# Patient Record
Sex: Female | Born: 1955 | Race: Black or African American | Hispanic: No | Smoking: Never smoker
Health system: Southern US, Community
[De-identification: ages and names within clinical notes are randomized; demographics above are authoritative.]

## PROBLEM LIST (undated history)

## (undated) DIAGNOSIS — I10 Essential (primary) hypertension: Secondary | ICD-10-CM

## (undated) DIAGNOSIS — F209 Schizophrenia, unspecified: Secondary | ICD-10-CM

## (undated) DIAGNOSIS — D649 Anemia, unspecified: Secondary | ICD-10-CM

## (undated) DIAGNOSIS — R7303 Prediabetes: Secondary | ICD-10-CM

## (undated) DIAGNOSIS — E785 Hyperlipidemia, unspecified: Secondary | ICD-10-CM

## (undated) DIAGNOSIS — R7989 Other specified abnormal findings of blood chemistry: Secondary | ICD-10-CM

## (undated) DIAGNOSIS — F32A Depression, unspecified: Secondary | ICD-10-CM

## (undated) DIAGNOSIS — I2510 Atherosclerotic heart disease of native coronary artery without angina pectoris: Secondary | ICD-10-CM

## (undated) DIAGNOSIS — D013 Carcinoma in situ of anus and anal canal: Secondary | ICD-10-CM

## (undated) DIAGNOSIS — F329 Major depressive disorder, single episode, unspecified: Secondary | ICD-10-CM

## (undated) DIAGNOSIS — B182 Chronic viral hepatitis C: Secondary | ICD-10-CM

## (undated) DIAGNOSIS — K6282 Dysplasia of anus: Secondary | ICD-10-CM

## (undated) HISTORY — PX: POLYPECTOMY: SHX149

## (undated) HISTORY — DX: Morbid (severe) obesity due to excess calories: E66.01

## (undated) HISTORY — DX: Hyperlipidemia, unspecified: E78.5

## (undated) HISTORY — DX: Atherosclerotic heart disease of native coronary artery without angina pectoris: I25.10

## (undated) HISTORY — PX: COLONOSCOPY: SHX174

## (undated) HISTORY — DX: Anemia, unspecified: D64.9

## (undated) HISTORY — DX: Prediabetes: R73.03

## (undated) HISTORY — DX: Other specified abnormal findings of blood chemistry: R79.89

## (undated) HISTORY — DX: Dysplasia of anus: K62.82

## (undated) HISTORY — DX: Depression, unspecified: F32.A

## (undated) HISTORY — DX: Essential (primary) hypertension: I10

## (undated) HISTORY — PX: VAGINAL HYSTERECTOMY: SHX2639

## (undated) HISTORY — DX: Chronic viral hepatitis C: B18.2

## (undated) HISTORY — DX: Schizophrenia, unspecified: F20.9

---
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
## 1998-02-23 ENCOUNTER — Other Ambulatory Visit: Admission: RE | Admit: 1998-02-23 | Discharge: 1998-02-23 | Payer: Self-pay | Admitting: Family Medicine

## 1998-03-02 ENCOUNTER — Other Ambulatory Visit: Admission: RE | Admit: 1998-03-02 | Discharge: 1998-03-02 | Payer: Self-pay | Admitting: Obstetrics and Gynecology

## 1998-04-02 ENCOUNTER — Ambulatory Visit (HOSPITAL_COMMUNITY): Admission: RE | Admit: 1998-04-02 | Discharge: 1998-04-02 | Payer: Self-pay | Admitting: Obstetrics and Gynecology

## 1999-02-01 ENCOUNTER — Other Ambulatory Visit: Admission: RE | Admit: 1999-02-01 | Discharge: 1999-02-01 | Payer: Self-pay

## 1999-06-23 ENCOUNTER — Ambulatory Visit (HOSPITAL_COMMUNITY): Admission: RE | Admit: 1999-06-23 | Discharge: 1999-06-23 | Payer: Self-pay | Admitting: *Deleted

## 1999-06-29 ENCOUNTER — Ambulatory Visit (HOSPITAL_COMMUNITY): Admission: RE | Admit: 1999-06-29 | Discharge: 1999-06-29 | Payer: Self-pay | Admitting: *Deleted

## 1999-12-30 ENCOUNTER — Encounter: Admission: RE | Admit: 1999-12-30 | Discharge: 1999-12-30 | Payer: Self-pay | Admitting: *Deleted

## 2000-04-20 ENCOUNTER — Emergency Department (HOSPITAL_COMMUNITY): Admission: EM | Admit: 2000-04-20 | Discharge: 2000-04-20 | Payer: Self-pay | Admitting: Emergency Medicine

## 2000-12-18 ENCOUNTER — Other Ambulatory Visit: Admission: RE | Admit: 2000-12-18 | Discharge: 2000-12-18 | Payer: Self-pay | Admitting: Family Medicine

## 2000-12-31 ENCOUNTER — Ambulatory Visit (HOSPITAL_COMMUNITY): Admission: RE | Admit: 2000-12-31 | Discharge: 2000-12-31 | Payer: Self-pay

## 2001-11-08 ENCOUNTER — Encounter: Payer: Self-pay | Admitting: Family Medicine

## 2001-11-08 ENCOUNTER — Ambulatory Visit (HOSPITAL_COMMUNITY): Admission: RE | Admit: 2001-11-08 | Discharge: 2001-11-08 | Payer: Self-pay | Admitting: Family Medicine

## 2001-12-25 ENCOUNTER — Other Ambulatory Visit: Admission: RE | Admit: 2001-12-25 | Discharge: 2001-12-25 | Payer: Self-pay | Admitting: Family Medicine

## 2002-01-02 ENCOUNTER — Ambulatory Visit (HOSPITAL_COMMUNITY): Admission: RE | Admit: 2002-01-02 | Discharge: 2002-01-02 | Payer: Self-pay | Admitting: Family Medicine

## 2002-03-21 ENCOUNTER — Encounter (INDEPENDENT_AMBULATORY_CARE_PROVIDER_SITE_OTHER): Payer: Self-pay | Admitting: Specialist

## 2002-03-21 ENCOUNTER — Inpatient Hospital Stay (HOSPITAL_COMMUNITY): Admission: RE | Admit: 2002-03-21 | Discharge: 2002-03-23 | Payer: Self-pay | Admitting: Obstetrics and Gynecology

## 2002-10-05 ENCOUNTER — Emergency Department (HOSPITAL_COMMUNITY): Admission: EM | Admit: 2002-10-05 | Discharge: 2002-10-05 | Payer: Self-pay | Admitting: Emergency Medicine

## 2003-01-06 ENCOUNTER — Ambulatory Visit (HOSPITAL_COMMUNITY): Admission: RE | Admit: 2003-01-06 | Discharge: 2003-01-06 | Payer: Self-pay | Admitting: Family Medicine

## 2004-01-14 ENCOUNTER — Ambulatory Visit (HOSPITAL_COMMUNITY): Admission: RE | Admit: 2004-01-14 | Discharge: 2004-01-14 | Payer: Self-pay | Admitting: Obstetrics and Gynecology

## 2005-01-20 ENCOUNTER — Ambulatory Visit (HOSPITAL_COMMUNITY): Admission: RE | Admit: 2005-01-20 | Discharge: 2005-01-20 | Payer: Self-pay | Admitting: Obstetrics and Gynecology

## 2006-01-19 ENCOUNTER — Ambulatory Visit (HOSPITAL_COMMUNITY): Admission: RE | Admit: 2006-01-19 | Discharge: 2006-01-19 | Payer: Self-pay | Admitting: Obstetrics and Gynecology

## 2007-01-21 ENCOUNTER — Ambulatory Visit (HOSPITAL_COMMUNITY): Admission: RE | Admit: 2007-01-21 | Discharge: 2007-01-21 | Payer: Self-pay | Admitting: Obstetrics and Gynecology

## 2007-12-11 ENCOUNTER — Encounter (INDEPENDENT_AMBULATORY_CARE_PROVIDER_SITE_OTHER): Payer: Self-pay | Admitting: Nurse Practitioner

## 2007-12-11 ENCOUNTER — Ambulatory Visit: Payer: Self-pay | Admitting: Internal Medicine

## 2007-12-11 LAB — CONVERTED CEMR LAB
Albumin: 3.5 g/dL (ref 3.5–5.2)
Basophils Absolute: 0 10*3/uL (ref 0.0–0.1)
Basophils Relative: 0 % (ref 0–1)
Calcium: 9.5 mg/dL (ref 8.4–10.5)
Chloride: 107 meq/L (ref 96–112)
Eosinophils Absolute: 0.3 10*3/uL (ref 0.0–0.7)
Glucose, Bld: 89 mg/dL (ref 70–99)
HCT: 35.7 % — ABNORMAL LOW (ref 36.0–46.0)
Hemoglobin: 11.5 g/dL — ABNORMAL LOW (ref 12.0–15.0)
Lymphs Abs: 4.5 10*3/uL — ABNORMAL HIGH (ref 0.7–4.0)
MCV: 75.2 fL — ABNORMAL LOW (ref 78.0–100.0)
Monocytes Absolute: 0.8 10*3/uL (ref 0.1–1.0)
Monocytes Relative: 7 % (ref 3–12)
Neutrophils Relative %: 53 % (ref 43–77)
Platelets: 268 10*3/uL (ref 150–400)
Potassium: 3.9 meq/L (ref 3.5–5.3)
RBC: 4.75 M/uL (ref 3.87–5.11)
Sodium: 141 meq/L (ref 135–145)
TSH: 0.759 microintl units/mL (ref 0.350–5.50)

## 2007-12-12 ENCOUNTER — Encounter (INDEPENDENT_AMBULATORY_CARE_PROVIDER_SITE_OTHER): Payer: Self-pay | Admitting: Nurse Practitioner

## 2007-12-12 LAB — CONVERTED CEMR LAB
Hep A Total Ab: POSITIVE — AB
Hep B Core Total Ab: NEGATIVE
Hep B E Ab: NEGATIVE

## 2008-01-23 ENCOUNTER — Ambulatory Visit (HOSPITAL_COMMUNITY): Admission: RE | Admit: 2008-01-23 | Discharge: 2008-01-23 | Payer: Self-pay | Admitting: Family Medicine

## 2008-03-10 ENCOUNTER — Ambulatory Visit: Payer: Self-pay | Admitting: Internal Medicine

## 2008-03-10 LAB — CONVERTED CEMR LAB
Cholesterol: 139 mg/dL (ref 0–200)
HCV Quantitative: 45400 intl units/mL — ABNORMAL HIGH (ref <43)

## 2008-04-29 ENCOUNTER — Ambulatory Visit: Payer: Self-pay | Admitting: Internal Medicine

## 2009-01-26 ENCOUNTER — Ambulatory Visit (HOSPITAL_COMMUNITY): Admission: RE | Admit: 2009-01-26 | Discharge: 2009-01-26 | Payer: Self-pay | Admitting: Internal Medicine

## 2009-02-05 ENCOUNTER — Ambulatory Visit: Payer: Self-pay | Admitting: Family Medicine

## 2009-02-05 ENCOUNTER — Encounter (INDEPENDENT_AMBULATORY_CARE_PROVIDER_SITE_OTHER): Payer: Self-pay | Admitting: Adult Health

## 2009-02-05 LAB — CONVERTED CEMR LAB
ALT: 27 units/L (ref 0–35)
AST: 24 units/L (ref 0–37)
BUN: 7 mg/dL (ref 6–23)
CO2: 24 meq/L (ref 19–32)
HDL: 57 mg/dL (ref >39)
Potassium: 4.2 meq/L (ref 3.5–5.3)
Total CHOL/HDL Ratio: 2.4
Total Protein: 7.7 g/dL (ref 6.0–8.3)
Triglycerides: 72 mg/dL (ref <150)

## 2009-02-06 ENCOUNTER — Emergency Department (HOSPITAL_COMMUNITY): Admission: EM | Admit: 2009-02-06 | Discharge: 2009-02-07 | Payer: Self-pay | Admitting: Emergency Medicine

## 2009-03-11 ENCOUNTER — Encounter (INDEPENDENT_AMBULATORY_CARE_PROVIDER_SITE_OTHER): Payer: Self-pay | Admitting: Adult Health

## 2009-03-11 ENCOUNTER — Ambulatory Visit: Payer: Self-pay | Admitting: Internal Medicine

## 2009-03-11 LAB — CONVERTED CEMR LAB
Basophils Absolute: 0 10*3/uL (ref 0.0–0.1)
Basophils Relative: 0 % (ref 0–1)
Eosinophils Absolute: 0.1 10*3/uL (ref 0.0–0.7)
HCT: 37.8 % (ref 36.0–46.0)
Lymphocytes Relative: 36 % (ref 12–46)
Lymphs Abs: 3.4 10*3/uL (ref 0.7–4.0)
Neutro Abs: 5.5 10*3/uL (ref 1.7–7.7)
Neutrophils Relative %: 58 % (ref 43–77)
Platelets: 237 10*3/uL (ref 150–400)
RBC: 5.11 M/uL (ref 3.87–5.11)
RDW: 15 % (ref 11.5–15.5)
WBC: 9.6 10*3/uL (ref 4.0–10.5)

## 2009-05-31 ENCOUNTER — Ambulatory Visit: Payer: Self-pay | Admitting: Internal Medicine

## 2010-01-03 ENCOUNTER — Ambulatory Visit: Payer: Self-pay | Admitting: Internal Medicine

## 2010-01-03 LAB — CONVERTED CEMR LAB
Basophils Absolute: 0 10*3/uL (ref 0.0–0.1)
Basophils Relative: 0 % (ref 0–1)
Eosinophils Relative: 1 % (ref 0–5)
HCT: 38.3 % (ref 36.0–46.0)
Microalb, Ur: 0.5 mg/dL (ref 0.00–1.89)
Monocytes Absolute: 0.6 10*3/uL (ref 0.1–1.0)
Monocytes Relative: 6 % (ref 3–12)
RBC: 5.2 M/uL — ABNORMAL HIGH (ref 3.87–5.11)
RDW: 14.5 % (ref 11.5–15.5)

## 2010-01-27 ENCOUNTER — Ambulatory Visit (HOSPITAL_COMMUNITY): Admission: RE | Admit: 2010-01-27 | Discharge: 2010-01-27 | Payer: Self-pay | Admitting: Internal Medicine

## 2010-01-31 ENCOUNTER — Ambulatory Visit: Payer: Self-pay | Admitting: Family Medicine

## 2010-11-21 LAB — DIFFERENTIAL
Basophils Absolute: 0.1 10*3/uL (ref 0.0–0.1)
Eosinophils Absolute: 0.1 10*3/uL (ref 0.0–0.7)
Lymphocytes Relative: 39 % (ref 12–46)

## 2010-11-21 LAB — CBC
HCT: 42.1 % (ref 36.0–46.0)
Platelets: 266 10*3/uL (ref 150–400)
RBC: 5.62 MIL/uL — ABNORMAL HIGH (ref 3.87–5.11)

## 2010-11-21 LAB — POCT I-STAT, CHEM 8
BUN: 8 mg/dL (ref 6–23)
HCT: 43 % (ref 36.0–46.0)
Potassium: 3.5 mEq/L (ref 3.5–5.1)
TCO2: 24 mmol/L (ref 0–100)

## 2010-11-21 LAB — URINALYSIS, ROUTINE W REFLEX MICROSCOPIC
Bilirubin Urine: NEGATIVE
Hgb urine dipstick: NEGATIVE
Protein, ur: NEGATIVE mg/dL
Urobilinogen, UA: 0.2 mg/dL (ref 0.0–1.0)

## 2010-11-21 LAB — URINE CULTURE: Colony Count: 4000

## 2010-12-30 NOTE — Op Note (Signed)
Dana Ayala, Dana Ayala                     ACCOUNT NO.:  [id number]   MEDICAL RECORD NO.:  [id number]                   PATIENT TYPE:  INP   LOCATION:  0478                                 FACILITY:  [facility]   PHYSICIAN:  Katherine Roan, M.D.               DATE OF BIRTH:  [birth year]   DATE OF PROCEDURE:  03/21/2002  DATE OF DISCHARGE:  03/23/2002                                 OPERATIVE REPORT   PREOPERATIVE DIAGNOSES:  1. Large uterine fibroid.  2. Pelvic pain.  3. Menorrhagia.  4. History of anemia.  5. Exogenous obesity.  6. Mental incapacity of undetermined etiology.   POSTOPERATIVE DIAGNOSES:  1. Large uterine fibroid.  2. Pelvic pain.  3. Menorrhagia.  4. History of anemia.  5. Exogenous obesity.  6. Mental incapacity of undetermined etiology.   OPERATION:  1. Pelvic exam under anesthesia.  2. Exploratory laparotomy.  3. Total abdominal hysterectomy, right salpingo-oophorectomy.   DESCRIPTION OF PROCEDURE:  The patient was placed in lithotomy position and  prepped and draped in the usual fashion.  Prior to prep and drape, pelvic  exam under anesthesia was accomplished.  The uterus was retroverted, mobile.  No masses were noted other than this large midline and retroverted pelvic  mass.  Foley catheter was inserted, prepped and draped in the usual fashion.  It was determined that a transverse Pfannenstiel incision was probably the  best for the patient, and this was made in the abdomen right in the lower  skin crease.  Upper fascia was then dissected anteriorly and placed on  traction using the ether screen.  The inferior fascial flap was mobilized as  well.  Hemostasis with 3-0 Vicryl and the Bovie.  The peritoneum was entered  with sharp dissection.  Upper abdomen was examined.  There were some  adhesions in the midline.  Liver and gallbladder appeared to be normal. I  could not palpate stones.  I felt no upper abdominal masses.  The patient  had marked  amount of preperitoneal fat and omental fat.   Carefully, the retractor was placed into the abdomen as the Balfour  retractor was then placed with medium sides, and I carefully packed the  abdominal contents away from the pelvis using a Balfour extender.  Bladder  blade was inserted.  The uterus was then elevated, and I transected the left  uteroovarian anastomosis.  This bled quite profusely.  We arrested this  bleeding with interrupted sutures of 0 chromic.  Round ligament was  identified.  I dissected the peritoneal lining and elevated the uterus using  Kelly clamps and identified the left uteroovarian pedicle.  We were able to  transect the uteroovarian ligament and tube on the left as well as the round  ligament.  We elevated the uterus.  The uterine vessels were skeletonized  and arrested, clamped with 0 chromic suture.  Several clamps and careful  clamps down the right side  of the uterine fundus to obtain the blood supply  to the uterus was obtained, and I was able to do a fundectomy.  Following  this, we put a single-tooth tenaculum on the cervical stump and removed it  without difficulty.  The  uterosacral ligaments were plicated in the midline  for vault support.  Hemostasis was secure.  Reperitonealized the pelvis with  3-0 Vicryl.  Again, hemostasis was secure.  The right ovary appeared to be  vascularly compromised because of all the bleeding and the marked amount of  varicosities within the infundibulopelvic ligament, and I simply clamped  across the infundibulopelvic ligament and removed the right tube and ovary.  The left tube and ovary appeared normal.  Hemostasis was secure.  I  irrigated the pelvis, removed all sponges.  Sponge and needle count was  correct.  The parietal peritoneum was closed with 2-0 PDS; fascia was closed  with 2-0 PDS and interrupted sutures of 0 Vicryl.  I irrigated the  subcutaneum and closed the skin with a subcuticular suture of 4-0 PDS.  The   patient tolerated the procedure well.  Estimated blood loss was between 300-  500 cc.                                               Katherine Roan, M.D.    SDM/MEDQ  D:  03/21/2002  T:  03/24/2002  Job:  [id number]

## 2010-12-30 NOTE — Discharge Summary (Signed)
   NAMESHEELA, Dana Ayala                      ACCOUNT NO.:  [id number]   MEDICAL RECORD NO.:  [id number]                   PATIENT TYPE:  INP   LOCATION:  0478                                 FACILITY:  [facility]   PHYSICIAN:  Katherine Roan, M.D.               DATE OF BIRTH:  01/14/1956   DATE OF ADMISSION:  03/23/2002  DATE OF DISCHARGE:  03/26/2002                                 DISCHARGE SUMMARY   ADMISSION DIAGNOSES:  Large uterine fibroids, adenomyosis.   DISCHARGE DIAGNOSES:  Large uterine fibroids, adenomyosis.   OPERATION PERFORMED:  Total abdominal hysterectomy, right salpingo-  oophorectomy.   BRIEF HISTORY:  The patient is a 55 year old female admitted to the hospital  with large uterine fibroids.  She had a history of severe dysplasia of the  cervix and she desired hysterectomy.   LABORATORY STUDIES:  Admission hemoglobin was 11, hematocrit 34.  She was  O+.   HOSPITAL COURSE:  The patient was admitted to the hospital.  Underwent an  uneventful hysterectomy with removal of a right ovary.  Her postoperative  course was uncomplicated.  She remained afebrile and without complaints and  was discharged on the second postoperative day to return to the office in  two weeks.   CONDITION ON DISCHARGE:  Improved.                                               Katherine Roan, M.D.    SDM/MEDQ  D:  04/08/2002  T:  04/09/2002  Job:  [id number]

## 2010-12-30 NOTE — H&P (Signed)
NAMEJAHLEAH, Dana Ayala                      ACCOUNT NO.:  [id number]   MEDICAL RECORD NO.:  [id number]                   PATIENT TYPE:  INP   LOCATION:  0478                                 FACILITY:  [facility]   PHYSICIAN:  Katherine Roan, M.D.               DATE OF BIRTH:  11/01/1955   DATE OF ADMISSION:  03/21/2002  DATE OF DISCHARGE:  03/23/2002                                HISTORY & PHYSICAL   CHIEF COMPLAINT:  Heavy periods and pelvic pain.   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 She has a history of having a tubal ligation  and in August of 1999, had a cold knife conization for severe dysplasia. Pap  smear subsequently had been normal. Ultrasound revealed she had a large 7 cm  fibroid. Because of this, a hysterectomy was recommended.   MEDICAL DECISION MAKING:  1. Thorazine.  2. Transene.  3. Iron.  4. An arthritis pill that we are not sure of.   PAST MEDICAL HISTORY:  Other than one delivery and being mentally challenge,  is unrevealing. The patient appears to be of stated age of probably [age] to  [age]   REVIEW OF SYSTEMS:  GENERAL: Denies headaches or dizziness. HEART: No  history of hypertension or rheumatic fever. No history of heart murmur.  LUNGS: No chronic cough. GU: Denies stress incontinence. GI: No bowel habit  changes. MUSCULOSKELETAL: She complains of pain with her knees and hips.   SOCIAL HISTORY:  She does not smoke or drink.   FAMILY HISTORY:  Her mother is deceased. Her father is living and well. She  has eight siblings. She is unsure of her family history. Her mother relates  that it is unrevealing.   PHYSICAL EXAMINATION:  GENERAL: A cooperative, well developed, well  nourished, obviously immature female who is appropriate in her behavior but  who is obviously mentally challenged.  VITAL SIGNS: Weight 251  pounds. Blood pressure 140/90.  HEENT: Unremarkable. Oropharynx is not injected.  NECK: Supple. Thyroid not enlarged. Carotid pulses are equal without bruits.  There is a short neck.  BREASTS: No masses or tenderness. They are quite large.  LUNGS: Clear to auscultation and percussion.  HEART: Normal sinus rhythm. No murmur, rub, or gallop.  ABDOMEN: Markedly obese. Liver, spleen, and kidneys not palpated. Bowel  sounds are normal. No bruits are heard. No tenderness.  PELVIC: Exam reveals normal vulva and vagina. Cervix is clean. Uterus is  retroverted, about three times normal size without other adnexal masses.  EXTREMITIES: Good range of motion and equal pulses and reflexes.    IMPRESSION:  Large uterine fibroid with history of severe dysplasia.   PLAN:  Total abdominal hysterectomy and possible bilateral salpingo-  oophorectomy.  Katherine Roan, M.D.    SDM/MEDQ  D:  03/20/2002  T:  03/23/2002  Job:  [id number]

## 2011-01-12 ENCOUNTER — Other Ambulatory Visit (HOSPITAL_COMMUNITY): Payer: Self-pay | Admitting: Family Medicine

## 2011-01-12 DIAGNOSIS — Z1231 Encounter for screening mammogram for malignant neoplasm of breast: Secondary | ICD-10-CM

## 2011-01-30 ENCOUNTER — Ambulatory Visit (HOSPITAL_COMMUNITY): Payer: Self-pay

## 2011-02-08 ENCOUNTER — Ambulatory Visit (HOSPITAL_COMMUNITY)
Admission: RE | Admit: 2011-02-08 | Discharge: 2011-02-08 | Disposition: A | Discharge status: Home / Self Care | Payer: Medicaid Other | Source: Ambulatory Visit | Attending: Family Medicine | Admitting: Family Medicine

## 2011-02-08 DIAGNOSIS — Z1231 Encounter for screening mammogram for malignant neoplasm of breast: Secondary | ICD-10-CM

## 2012-01-03 ENCOUNTER — Other Ambulatory Visit (HOSPITAL_COMMUNITY): Payer: Self-pay | Admitting: Family Medicine

## 2012-01-03 DIAGNOSIS — Z1231 Encounter for screening mammogram for malignant neoplasm of breast: Secondary | ICD-10-CM

## 2012-02-09 ENCOUNTER — Ambulatory Visit (HOSPITAL_COMMUNITY)
Admission: RE | Admit: 2012-02-09 | Discharge: 2012-02-09 | Disposition: A | Discharge status: Home / Self Care | Payer: Medicaid Other | Source: Ambulatory Visit | Attending: Family Medicine | Admitting: Family Medicine

## 2012-02-09 DIAGNOSIS — Z1231 Encounter for screening mammogram for malignant neoplasm of breast: Secondary | ICD-10-CM | POA: Insufficient documentation

## 2015-11-03 ENCOUNTER — Encounter: Payer: Self-pay | Admitting: Gastroenterology

## 2015-12-27 ENCOUNTER — Ambulatory Visit (INDEPENDENT_AMBULATORY_CARE_PROVIDER_SITE_OTHER): Payer: Medicaid Other | Admitting: Gastroenterology

## 2015-12-27 ENCOUNTER — Encounter: Payer: Self-pay | Admitting: Gastroenterology

## 2015-12-27 VITALS — BP 150/100 | HR 86 | Ht 61.5 in | Wt 248.0 lb

## 2015-12-27 DIAGNOSIS — Z1211 Encounter for screening for malignant neoplasm of colon: Secondary | ICD-10-CM

## 2015-12-27 DIAGNOSIS — B182 Chronic viral hepatitis C: Secondary | ICD-10-CM | POA: Diagnosis not present

## 2015-12-27 DIAGNOSIS — B1920 Unspecified viral hepatitis C without hepatic coma: Secondary | ICD-10-CM | POA: Insufficient documentation

## 2015-12-27 MED ORDER — NA SULFATE-K SULFATE-MG SULF 17.5-3.13-1.6 GM/177ML PO SOLN: 1.0000 | Freq: Once | ORAL | Status: DC

## 2015-12-27 NOTE — Patient Instructions (Signed)
If you are age 60 or older, your body mass index should be between 23-30. Your Body mass index is 46.11 kg/(m^2). If this is out of the aforementioned range listed, please consider follow up with your Primary Care Provider.  If you are age 59 or younger, your body mass index should be between 19-25. Your Body mass index is 46.11 kg/(m^2). If this is out of the aformentioned range listed, please consider follow up with your Primary Care Provider.   You have been scheduled for a colonoscopy. Please follow written instructions given to you at your visit today.  Please pick up your prep supplies at the pharmacy within the next 1-3 days. If you use inhalers (even only as needed), please bring them with you on the day of your procedure.  Thank you for choosing [facility]  Dr Chauncey Cruel.Armbruster

## 2015-12-27 NOTE — Progress Notes (Signed)
HPI :  60 y/o female here for a new patient visit to discuss options for colon cancer screening. She is accompanied by her daughter and brother today.    No prior colon cacner screening. She has roughly one BM per day. No blood in the stools. She has occasional cramping with a bowel movements when she has a BM, and is relieved with a bowel movement reliably, but does not cause her any significant discomfort. She has occasional loose stools, but usually normal formed. No FH of colon cancer. She has been trying to lose weight, no expected weight loss.   She has been taking hepatitis C regimen, Harvoni, for the past 1.5 months or so. She has been seeing a "liver specialist" for this, although no records available, and does not remember who she sees for this. No history of known cirrhosis. Tolerating Harvoni well. She should be done within the next few weeks.   The patient was not sure which medications she was taking, was reviewed with family.   Past Medical History  Diagnosis Date  . Hypertension   . Hepatitis C, chronic ([place])   . Schizophrenia ([place])   . Morbid obesity [facility])     Past Surgical History  Procedure Laterality Date  . Vaginal hysterectomy     Family History  Problem Relation Age of Onset  . Hypertension Mother   . Hypertension Father   . Hypertension Brother    Social History  Substance Use Topics  . Smoking status: Never Smoker   . Smokeless tobacco: Never Used  . Alcohol Use: No   Current Outpatient Prescriptions  Medication Sig Dispense Refill  . buPROPion (WELLBUTRIN SR) 150 MG 12 hr tablet Take 150 mg by mouth daily.    Marland Kitchen HARVONI 90-400 MG TABS     . paliperidone (INVEGA SUSTENNA) 156 MG/ML SUSP injection Inject 156 mg into the muscle every 30 (thirty) days.    . traZODone (DESYREL) 100 MG tablet Take 200 mg by mouth at bedtime.     No current facility-administered medications for this visit.   No Known Allergies   Review of Systems: All systems  reviewed and negative except where noted in HPI.    No results found.  Physical Exam: BP 150/100 mmHg  Pulse 86  Ht 5' 1.5" (1.562 m)  Wt 248 lb (112.492 kg)  BMI 46.11 kg/m2 Constitutional: Pleasant, female in no acute distress. HEENT: Normocephalic and atraumatic. Conjunctivae are normal. No scleral icterus. Neck supple.  Cardiovascular: Normal rate, regular rhythm.  Pulmonary/chest: Effort normal and breath sounds normal. No wheezing, rales or rhonchi. Abdominal: Soft, nondistended, nontender. Protuberant / obese abdomen, Bowel sounds active throughout. There are no masses palpable. No hepatomegaly. Extremities: no edema Lymphadenopathy: No cervical adenopathy noted. Neurological: Alert and oriented to person place and time. Skin: Skin is warm and dry. No rashes noted. Psychiatric: Normal mood and affect. Behavior is normal.   ASSESSMENT AND PLAN: 60 y/o female with history of schizophrenia, obesity, and hepatitis C, here for first time colon cancer screening. I have no baseline labs for her, so will obtain these from her primary care to assess for anemia, platelet count, and INR given her history of hepatitis C. I otherwise discussed screening modalities for her to include optical colonoscopy and stool testing. Family strongly wished to have optical colonoscopy done although I think she is a good candidate for stool testing given she has no alarm symptoms. Following our discussion of this issue, she wanted to proceed with  optical colonoscopy. We will obtain prior labs in the interim to ensure okay. She agreed.   She will otherwise follow up with ordering prescriber of her Harvoni to ensure she has had an appropriate response. Family assures me she has had no evidence of cirrhosis historically, but will await her labs.   The indications, risks, and benefits of colonoscopy were explained to the patient in detail. Risks include but are not limited to bleeding, perforation, adverse  reaction to medications, and cardiopulmonary compromise. Sequelae include but are not limited to the possibility of surgery, hospitalization, and mortality. The patient verbalized understanding and wished to proceed. All questions answered, referred to the scheduler and bowel prep ordered. Further recommendations pending results of the exam.   [place] Cellar, MD [facility] Pager [phone]  CC: Illa Level, *

## 2015-12-31 ENCOUNTER — Other Ambulatory Visit: Payer: Self-pay

## 2015-12-31 DIAGNOSIS — Z1211 Encounter for screening for malignant neoplasm of colon: Secondary | ICD-10-CM

## 2016-01-03 ENCOUNTER — Telehealth: Payer: Self-pay | Admitting: Gastroenterology

## 2016-01-03 NOTE — Telephone Encounter (Signed)
Labs returned following recent visit with me in clinic as follows:  10/18/15: AP 67, AST 32, ALT 38, Alb 3.4, T prot 7.9, T bil 0,3 BUN 17, CR 0.83, Na 140, K 4.4  CBC - WBC 10.9, Hgb 12.7, HCT 39.0, platelets 251, MCV 73  Genotype Ib HCV, Fibrosis F2 on Fibrosure test HCV 11/30/2015 - undetectable RNA  We will proceed with colonoscopy as planned.

## 2016-01-17 ENCOUNTER — Ambulatory Visit (AMBULATORY_SURGERY_CENTER): Payer: Medicaid Other | Admitting: Gastroenterology

## 2016-01-17 ENCOUNTER — Encounter: Payer: Self-pay | Admitting: Gastroenterology

## 2016-01-17 VITALS — BP 124/74 | HR 86 | Temp 98.0°F | Resp 16 | Ht 61.0 in | Wt 248.0 lb

## 2016-01-17 DIAGNOSIS — D120 Benign neoplasm of cecum: Secondary | ICD-10-CM | POA: Diagnosis not present

## 2016-01-17 DIAGNOSIS — K635 Polyp of colon: Secondary | ICD-10-CM

## 2016-01-17 DIAGNOSIS — D128 Benign neoplasm of rectum: Secondary | ICD-10-CM

## 2016-01-17 DIAGNOSIS — B182 Chronic viral hepatitis C: Secondary | ICD-10-CM

## 2016-01-17 DIAGNOSIS — Z1211 Encounter for screening for malignant neoplasm of colon: Secondary | ICD-10-CM

## 2016-01-17 DIAGNOSIS — K621 Rectal polyp: Secondary | ICD-10-CM

## 2016-01-17 DIAGNOSIS — D124 Benign neoplasm of descending colon: Secondary | ICD-10-CM

## 2016-01-17 MED ORDER — SODIUM CHLORIDE 0.9 % IV SOLN: 500.0000 mL | INTRAVENOUS | Status: DC

## 2016-01-17 NOTE — Progress Notes (Signed)
Called to room to assist during endoscopic procedure.  Patient ID and intended procedure confirmed with present staff. Received instructions for my participation in the procedure from the performing physician.  

## 2016-01-17 NOTE — Progress Notes (Signed)
Report given to PACU RN, vss 

## 2016-01-17 NOTE — Patient Instructions (Signed)
YOU HAD AN ENDOSCOPIC PROCEDURE TODAY AT [facility]:   Refer to the procedure report that was given to you for any specific questions about what was found during the examination.  If the procedure report does not answer your questions, please call your gastroenterologist to clarify.  If you requested that your care partner not be given the details of your procedure findings, then the procedure report has been included in a sealed envelope for you to review at your convenience later.  YOU SHOULD EXPECT: Some feelings of bloating in the abdomen. Passage of more gas than usual.  Walking can help get rid of the air that was put into your GI tract during the procedure and reduce the bloating. If you had a lower endoscopy (such as a colonoscopy or flexible sigmoidoscopy) you may notice spotting of blood in your stool or on the toilet paper. If you underwent a bowel prep for your procedure, you may not have a normal bowel movement for a few days.  Please Note:  You might notice some irritation and congestion in your nose or some drainage.  This is from the oxygen used during your procedure.  There is no need for concern and it should clear up in a day or so.  SYMPTOMS TO REPORT IMMEDIATELY:   Following lower endoscopy (colonoscopy or flexible sigmoidoscopy):  Excessive amounts of blood in the stool  Significant tenderness or worsening of abdominal pains  Swelling of the abdomen that is new, acute  Fever of 100F or higher  For urgent or emergent issues, a gastroenterologist can be reached at any hour by calling [phone].  DIET: Your first meal following the procedure should be a small meal and then it is ok to progress to your normal diet. Heavy or fried foods are harder to digest and may make you feel nauseous or bloated.  Likewise, meals heavy in dairy and vegetables can increase bloating.  Drink plenty of fluids but you should avoid alcoholic beverages for 24 hours.  ACTIVITY:   You should plan to take it easy for the rest of today and you should NOT DRIVE or use heavy machinery until tomorrow (because of the sedation medicines used during the test).    FOLLOW UP: Our staff will call the number listed on your records the next business day following your procedure to check on you and address any questions or concerns that you may have regarding the information given to you following your procedure. If we do not reach you, we will leave a message.  However, if you are feeling well and you are not experiencing any problems, there is no need to return our call.  We will assume that you have returned to your regular daily activities without incident.  If any biopsies were taken you will be contacted by phone or by letter within the next 1-3 weeks.  Please call us at [phone] if you have not heard about the biopsies in 3 weeks.   SIGNATURES/CONFIDENTIALITY: You and/or your care partner have signed paperwork which will be entered into your electronic medical record.  These signatures attest to the fact that that the information above on your After Visit Summary has been reviewed and is understood.  Full responsibility of the confidentiality of this discharge information lies with you and/or your care-partner.  Please read over handouts about polyps, diverticulosis, hemorrhoids and high fiber diets  Await pathology  NO ASPIRIN, ASPIRIN CONTAINING PRODUCTS (BC OR GOODY POWDERS) OR NSAIDS (  IBUPROFEN, ADVIL, ALEVE, MOTRIN) FOR 2 WEEKS,  Please continue other medications

## 2016-01-17 NOTE — Op Note (Signed)
[facility] Patient Name: Dana Ayala Procedure Date: 01/17/2016 3:07 PM MRN: [id number] Endoscopist: Remo Lipps P. Havery Moros , MD Age: 60 Referring MD:  Date of Birth: 1955-08-22 Gender: Female Procedure:                Colonoscopy Indications:              Screening for malignant neoplasm in the colon, This                            is the patient's first colonoscopy Medicines:                Monitored Anesthesia Care Procedure:                Pre-Anesthesia Assessment:                           - Prior to the procedure, a History and Physical                            was performed, and patient medications and                            allergies were reviewed. The patient's tolerance of                            previous anesthesia was also reviewed. The risks                            and benefits of the procedure and the sedation                            options and risks were discussed with the patient.                            All questions were answered, and informed consent                            was obtained. Prior Anticoagulants: The patient has                            taken no previous anticoagulant or antiplatelet                            agents. ASA Grade Assessment: III - A patient with                            severe systemic disease. After reviewing the risks                            and benefits, the patient was deemed in                            satisfactory condition to undergo the procedure.  After obtaining informed consent, the colonoscope                            was passed under direct vision. Throughout the                            procedure, the patient's blood pressure, pulse, and                            oxygen saturations were monitored continuously. The                            Model CF-HQ190L [id number]) scope was introduced                            through the anus and advanced to  the the cecum,                            identified by appendiceal orifice and ileocecal                            valve. The colonoscopy was performed without                            difficulty. The patient tolerated the procedure                            well. The quality of the bowel preparation was                            adequate. Scope In: 3:19:44 PM Scope Out: 3:38:49 PM Scope Withdrawal Time: 0 hours 16 minutes 38 seconds  Total Procedure Duration: 0 hours 19 minutes 5 seconds  Findings:                 The perianal and digital rectal examinations were                            normal.                           Two sessile polyps were found in the cecum. The                            polyps were 3 mm in size. These polyps were removed                            with a cold snare. Resection and retrieval were                            complete.                           A 10 mm polyp was found in the descending colon.  The polyp was pedunculated. The polyp was removed                            with a hot snare. Resection and retrieval were                            complete.                           A 3 mm polyp was found in the rectum. The polyp was                            sessile. The polyp was removed with a cold snare.                            Resection and retrieval were complete.                           Scattered medium-mouthed diverticula were found in                            the left colon.                           Non-bleeding internal hemorrhoids were found during                            retroflexion. The hemorrhoids were small.                           The exam was otherwise without abnormality. Complications:            No immediate complications. Estimated blood loss:                            Minimal. Estimated Blood Loss:     Estimated blood loss was minimal. Impression:               - Two 3 mm polyps in  the cecum, removed with a cold                            snare. Resected and retrieved.                           - One 10 mm polyp in the descending colon, removed                            with a hot snare. Resected and retrieved.                           - One 3 mm polyp in the rectum, removed with a cold                            snare. Resected and retrieved.                           -  Diverticulosis in the left colon.                           - Non-bleeding internal hemorrhoids.                           - The examination was otherwise normal. Recommendation:           - Patient has a contact number available for                            emergencies. The signs and symptoms of potential                            delayed complications were discussed with the                            patient. Return to normal activities tomorrow.                            Written discharge instructions were provided to the                            patient.                           - Resume previous diet.                           - Continue present medications.                           - No aspirin, ibuprofen, naproxen, or other                            non-steroidal anti-inflammatory drugs for 2 weeks                            after polyp removal.                           - Await pathology results.                           - Repeat colonoscopy is recommended for                            surveillance. The colonoscopy date will be                            determined after pathology results from today's                            exam become available for review. Remo Lipps P. Armbruster, MD 01/17/2016 3:43:30 PM This report has been signed electronically.

## 2016-01-17 NOTE — Progress Notes (Signed)
No problems noted in the recovery room. maw 

## 2016-01-18 ENCOUNTER — Telehealth: Payer: Self-pay

## 2016-01-18 NOTE — Telephone Encounter (Signed)
  Follow up Call-  Call back number 01/17/2016  Post procedure Call Back phone  # [phone]  Permission to leave phone message Yes     Patient questions:  Do you have a fever, pain , or abdominal swelling? No. Pain Score  0 *  Have you tolerated food without any problems? Yes.    Have you been able to return to your normal activities? Yes.    Do you have any questions about your discharge instructions: Diet   No. Medications  No. Follow up visit  No.  Do you have questions or concerns about your Care? No.  Actions: * If pain score is 4 or above: No action needed, pain <4.

## 2016-01-20 ENCOUNTER — Encounter: Payer: Self-pay | Admitting: Gastroenterology

## 2018-06-03 ENCOUNTER — Other Ambulatory Visit: Payer: Self-pay | Admitting: Specialist

## 2018-06-03 DIAGNOSIS — Z1231 Encounter for screening mammogram for malignant neoplasm of breast: Secondary | ICD-10-CM

## 2018-07-08 ENCOUNTER — Ambulatory Visit
Admission: RE | Admit: 2018-07-08 | Discharge: 2018-07-08 | Disposition: A | Discharge status: Home / Self Care | Payer: Medicaid Other | Source: Ambulatory Visit | Attending: Specialist | Admitting: Specialist

## 2018-07-08 DIAGNOSIS — Z1231 Encounter for screening mammogram for malignant neoplasm of breast: Secondary | ICD-10-CM

## 2019-01-30 ENCOUNTER — Encounter: Payer: Self-pay | Admitting: Internal Medicine

## 2019-04-28 ENCOUNTER — Encounter: Payer: Self-pay | Admitting: Gastroenterology

## 2019-05-16 ENCOUNTER — Ambulatory Visit (AMBULATORY_SURGERY_CENTER): Payer: Self-pay | Admitting: *Deleted

## 2019-05-16 ENCOUNTER — Other Ambulatory Visit: Payer: Self-pay

## 2019-05-16 VITALS — Temp 97.6°F | Ht 61.0 in | Wt 230.0 lb

## 2019-05-16 DIAGNOSIS — Z86010 Personal history of colonic polyps: Secondary | ICD-10-CM

## 2019-05-16 MED ORDER — NA SULFATE-K SULFATE-MG SULF 17.5-3.13-1.6 GM/177ML PO SOLN: 1.0000 | Freq: Once | ORAL | 0 refills | Status: AC

## 2019-05-16 NOTE — Progress Notes (Signed)

## 2019-05-26 ENCOUNTER — Telehealth: Payer: Self-pay

## 2019-05-26 NOTE — Telephone Encounter (Signed)
Pt responded "no" to all screening questions °

## 2019-05-26 NOTE — Telephone Encounter (Signed)
Covid-19 screening questions   Do you now or have you had a fever in the last 14 days?  Do you have any respiratory symptoms of shortness of breath or cough now or in the last 14 days?  Do you have any family members or close contacts with diagnosed or suspected Covid-19 in the past 14 days?  Have you been tested for Covid-19 and found to be positive?       

## 2019-05-27 ENCOUNTER — Encounter: Payer: Self-pay | Admitting: Gastroenterology

## 2019-05-27 ENCOUNTER — Ambulatory Visit (AMBULATORY_SURGERY_CENTER): Payer: Medicaid Other | Admitting: Gastroenterology

## 2019-05-27 ENCOUNTER — Other Ambulatory Visit: Payer: Self-pay

## 2019-05-27 VITALS — BP 143/77 | HR 84 | Temp 98.2°F | Resp 13 | Ht 61.0 in | Wt 230.0 lb

## 2019-05-27 DIAGNOSIS — Z86010 Personal history of colonic polyps: Secondary | ICD-10-CM | POA: Diagnosis not present

## 2019-05-27 DIAGNOSIS — D129 Benign neoplasm of anus and anal canal: Secondary | ICD-10-CM | POA: Diagnosis not present

## 2019-05-27 DIAGNOSIS — D123 Benign neoplasm of transverse colon: Secondary | ICD-10-CM

## 2019-05-27 DIAGNOSIS — D122 Benign neoplasm of ascending colon: Secondary | ICD-10-CM

## 2019-05-27 DIAGNOSIS — D128 Benign neoplasm of rectum: Secondary | ICD-10-CM | POA: Diagnosis not present

## 2019-05-27 DIAGNOSIS — D125 Benign neoplasm of sigmoid colon: Secondary | ICD-10-CM

## 2019-05-27 MED ORDER — SODIUM CHLORIDE 0.9 % IV SOLN: 500.0000 mL | Freq: Once | INTRAVENOUS | Status: DC

## 2019-05-27 NOTE — Op Note (Addendum)
[place] Patient Name: Dana Ayala Procedure Date: 05/27/2019 11:27 AM MRN: [id number] Endoscopist: Remo Lipps P. Havery Moros , MD Age: 63 Referring MD:  Date of Birth: 1956-07-07 Gender: Female Account #: [id number] Procedure:                Colonoscopy Indications:              Surveillance: Personal history of adenomatous                            polyps (advanced adenoma) on last colonoscopy 3                            years ago Medicines:                Monitored Anesthesia Care Procedure:                Pre-Anesthesia Assessment:                           - Prior to the procedure, a History and Physical                            was performed, and patient medications and                            allergies were reviewed. The patient's tolerance of                            previous anesthesia was also reviewed. The risks                            and benefits of the procedure and the sedation                            options and risks were discussed with the patient.                            All questions were answered, and informed consent                            was obtained. Prior Anticoagulants: The patient has                            taken no previous anticoagulant or antiplatelet                            agents. ASA Grade Assessment: III - A patient with                            severe systemic disease. After reviewing the risks                            and benefits, the patient was deemed in  satisfactory condition to undergo the procedure.                           After obtaining informed consent, the colonoscope                            was passed under direct vision. Throughout the                            procedure, the patient's blood pressure, pulse, and                            oxygen saturations were monitored continuously. The                            LOANER 0255 was introduced through the  anus and                            advanced to the the cecum, identified by                            appendiceal orifice and ileocecal valve. The                            colonoscopy was performed without difficulty. The                            patient tolerated the procedure well. The quality                            of the bowel preparation was adequate. The                            ileocecal valve, appendiceal orifice, and rectum                            were photographed. Scope In: 11:31:57 AM Scope Out: 11:56:28 AM Scope Withdrawal Time: 0 hours 17 minutes 30 seconds  Total Procedure Duration: 0 hours 24 minutes 31 seconds  Findings:                 The perianal and digital rectal examinations were                            normal.                           A diminutive polyp was found in the ascending                            colon. The polyp was sessile. The polyp was removed                            with a cold snare. Resection and retrieval were  complete.                           Two sessile polyps were found in the transverse                            colon. The polyps were 3 to 4 mm in size. These                            polyps were removed with a cold snare. Resection                            and retrieval were complete.                           A diminutive polyp was found in the sigmoid colon.                            The polyp was sessile. The polyp was removed with a                            cold snare. Resection and retrieval were complete.                           A diminutive polyp was found in the rectum. The                            polyp was sessile. The polyp was removed with a                            cold snare. Resection and retrieval were complete.                           Multiple medium-mouthed diverticula were found in                            the sigmoid colon.                           A  localized area of mucosa roughly 54mm in diameter                            was found at the dentate line extending into anal                            canal. Not well seen on retroflexion, better seen                            on forward view. Unclear if benign hyperplastic                            change vs. flat adenoma / AIN. Biopsies were taken  with a cold forceps for histology.                           Internal hemorrhoids were noted. The exam was                            otherwise without abnormality. Complications:            No immediate complications. Estimated blood loss:                            Minimal. Estimated Blood Loss:     Estimated blood loss was minimal. Impression:               - One diminutive polyp in the ascending colon,                            removed with a cold snare. Resected and retrieved.                           - Two 3 to 4 mm polyps in the transverse colon,                            removed with a cold snare. Resected and retrieved.                           - One diminutive polyp in the sigmoid colon,                            removed with a cold snare. Resected and retrieved.                           - One diminutive polyp in the rectum, removed with                            a cold snare. Resected and retrieved.                           - Diverticulosis in the sigmoid colon.                           - Localized area of abnormal mucosa at the dentate                            line extending into the anal canal as outlined.                            Biopsied.                           - Internal hemorrhoids.                           - The examination was otherwise normal. Recommendation:           - Patient has a  contact number available for                            emergencies. The signs and symptoms of potential                            delayed complications were discussed with the                             patient. Return to normal activities tomorrow.                            Written discharge instructions were provided to the                            patient.                           - Resume previous diet.                           - Continue present medications.                           - Await pathology results. Remo Lipps P. Tyeisha Dinan, MD 05/27/2019 12:03:00 PM This report has been signed electronically.

## 2019-05-27 NOTE — Progress Notes (Signed)
PT taken to PACU. Monitors in place. VSS. Report given to RN. 

## 2019-05-27 NOTE — Progress Notes (Signed)
Pt's states no medical or surgical changes since previsit or office visit. 

## 2019-05-27 NOTE — Patient Instructions (Signed)
Discharge instructions given. Handouts on polyps and diverticulosis. Resume previous medications. YOU HAD AN ENDOSCOPIC PROCEDURE TODAY AT THE [facility]:   Refer to the procedure report that was given to you for any specific questions about what was found during the examination.  If the procedure report does not answer your questions, please call your gastroenterologist to clarify.  If you requested that your care partner not be given the details of your procedure findings, then the procedure report has been included in a sealed envelope for you to review at your convenience later.  YOU SHOULD EXPECT: Some feelings of bloating in the abdomen. Passage of more gas than usual.  Walking can help get rid of the air that was put into your GI tract during the procedure and reduce the bloating. If you had a lower endoscopy (such as a colonoscopy or flexible sigmoidoscopy) you may notice spotting of blood in your stool or on the toilet paper. If you underwent a bowel prep for your procedure, you may not have a normal bowel movement for a few days.  Please Note:  You might notice some irritation and congestion in your nose or some drainage.  This is from the oxygen used during your procedure.  There is no need for concern and it should clear up in a day or so.  SYMPTOMS TO REPORT IMMEDIATELY:   Following lower endoscopy (colonoscopy or flexible sigmoidoscopy):  Excessive amounts of blood in the stool  Significant tenderness or worsening of abdominal pains  Swelling of the abdomen that is new, acute  Fever of 100F or higher   For urgent or emergent issues, a gastroenterologist can be reached at any hour by calling [phone].   DIET:  We do recommend a small meal at first, but then you may proceed to your regular diet.  Drink plenty of fluids but you should avoid alcoholic beverages for 24 hours.  ACTIVITY:  You should plan to take it easy for the rest of today and you should NOT  DRIVE or use heavy machinery until tomorrow (because of the sedation medicines used during the test).    FOLLOW UP: Our staff will call the number listed on your records 48-72 hours following your procedure to check on you and address any questions or concerns that you may have regarding the information given to you following your procedure. If we do not reach you, we will leave a message.  We will attempt to reach you two times.  During this call, we will ask if you have developed any symptoms of COVID 19. If you develop any symptoms (ie: fever, flu-like symptoms, shortness of breath, cough etc.) before then, please call [phone].  If you test positive for Covid 19 in the 2 weeks post procedure, please call and report this information to us.    If any biopsies were taken you will be contacted by phone or by letter within the next 1-3 weeks.  Please call us at [phone] if you have not heard about the biopsies in 3 weeks.    SIGNATURES/CONFIDENTIALITY: You and/or your care partner have signed paperwork which will be entered into your electronic medical record.  These signatures attest to the fact that that the information above on your After Visit Summary has been reviewed and is understood.  Full responsibility of the confidentiality of this discharge information lies with you and/or your care-partner. 

## 2019-05-27 NOTE — Progress Notes (Signed)
Called to room to assist during endoscopic procedure.  Patient ID and intended procedure confirmed with present staff. Received instructions for my participation in the procedure from the performing physician.  

## 2019-05-29 ENCOUNTER — Telehealth: Payer: Self-pay | Admitting: *Deleted

## 2019-05-29 NOTE — Telephone Encounter (Signed)
  Follow up Call-  Call back number 05/27/2019  Post procedure Call Back phone  # [phone]  Permission to leave phone message No  Some recent data might be hidden     Patient questions:  Do you have a fever, pain , or abdominal swelling? No. Pain Score  0 *  Have you tolerated food without any problems? Yes.    Have you been able to return to your normal activities? Yes.    Do you have any questions about your discharge instructions: Diet   No. Medications  No. Follow up visit  No.  Do you have questions or concerns about your Care? No.  Actions: * If pain score is 4 or above: No action needed, pain <4.  1. Have you developed a fever since your procedure? no  2.   Have you had an respiratory symptoms (SOB or cough) since your procedure? no  3.   Have you tested positive for COVID 19 since your procedure no  4.   Have you had any family members/close contacts diagnosed with the COVID 19 since your procedure?  no   If yes to any of these questions please route to Joylene John, RN and Alphonsa Gin, Therapist, sports.

## 2019-06-06 ENCOUNTER — Telehealth: Payer: Self-pay | Admitting: Gastroenterology

## 2019-06-06 NOTE — Telephone Encounter (Signed)
Thanks Vaughan Basta, that is fine. She has a small area of AIN and needs to be addressed with the surgeons. Not urgent but she does need to see them, I'm happy to talk more with her about this in the office.

## 2019-06-06 NOTE — Telephone Encounter (Signed)
Sarach for [facility] called and stated we sent over a referral on the 20th .   When she reached out to patient she said that the  pt thought it was a trick and was confused she denied scheduling an appt and thought it was to soon.  She tought we should try and speak with her about this  appt

## 2019-06-06 NOTE — Telephone Encounter (Signed)
Called and spoke with pt and discussed that she had been notified of colon results earlier this week and that she was going to be referred to [facility]. [facility] called her today and she stated it was to soon. Explained we were calling to follow-up with her to see if she was ready to schedule with [facility]. Pt states it it to soon and she is depressed. Long pauses of silence on the phone. Offered to schedule pt an appt to come see Dr. Havery Moros so he can explain things more in detail for her and answer her questions. Pt scheduled to see Dr. Havery Moros 07/01/19@4pm . Pt aware of appt and Dr. Havery Moros notified.

## 2019-06-13 ENCOUNTER — Other Ambulatory Visit: Payer: Self-pay | Admitting: Internal Medicine

## 2019-06-13 DIAGNOSIS — Z1231 Encounter for screening mammogram for malignant neoplasm of breast: Secondary | ICD-10-CM

## 2019-07-01 ENCOUNTER — Telehealth: Payer: Self-pay

## 2019-07-01 ENCOUNTER — Ambulatory Visit: Payer: Medicaid Other | Admitting: Gastroenterology

## 2019-07-01 NOTE — Telephone Encounter (Signed)
Called and spoke to an individual who would not identify herself. Asked her to take a message for the pt to call back to reschedule her missed appt from today with Dr. Havery Moros and it was communicated that it is very important that she meet with Dr. Havery Moros. The individual asked what it was regarding and if it was important. She seemed very confused and was no responsive when I asked several straight forward questions.  I sense she may have been the pt but she would not identify herself.  I am mailing her a no show letter asking her to call and reschedule her appointment.

## 2019-07-01 NOTE — Progress Notes (Deleted)
HPI :   Colonoscopy 05/27/19 - The perianal and digital rectal examinations were normal. - A diminutive polyp was found in the ascending colon. The polyp was sessile. The polyp was removed with a cold snare. Resection and retrieval were complete. - Two sessile polyps were found in the transverse colon. The polyps were 3 to 4 mm in size. These polyps were removed with a cold snare. Resection and retrieval were complete. - A diminutive polyp was found in the sigmoid colon. The polyp was sessile. The polyp was removed with a cold snare. Resection and retrieval were complete. - A diminutive polyp was found in the rectum. The polyp was sessile. The polyp was removed with a cold snare. Resection and retrieval were complete. - Multiple medium-mouthed diverticula were found in the sigmoid colon. - A localized area of mucosa roughly 74mm in diameter was found at the dentate line extending into anal canal. Not well seen on retroflexion, better seen on forward view. Unclear if benign hyperplastic change vs. flat adenoma / AIN. Biopsies were taken with a cold forceps for histology.  Path shows 2 of the polyps were adenomatous, and biopsies of the dentate line c/w AIN  Colonoscopy 01/17/16 The perianal and digital rectal examinations were normal. - Two sessile polyps were found in the cecum. The polyps were 3 mm in size. These polyps were removed with a cold snare. Resection and retrieval were complete. - A 10 mm polyp was found in the descending colon. The polyp was pedunculated. The polyp was removed with a hot snare. Resection and retrieval were complete. - A 3 mm polyp was found in the rectum. The polyp was sessile. The polyp was removed with a cold snare. Resection and retrieval were complete. - Scattered medium-mouthed diverticula were found in the left colon. - Non-bleeding internal hemorrhoids were found during retroflexion. The hemorrhoids were small. - The exam was otherwise without  abnormality.   Past Medical History:  Diagnosis Date  . Anemia   . Depression   . Hepatitis C, chronic ([place])   . Hypertension   . Morbid obesity ([place])   . Schizophrenia [facility])      Past Surgical History:  Procedure Laterality Date  . COLONOSCOPY    . POLYPECTOMY    . VAGINAL HYSTERECTOMY     Family History  Problem Relation Age of Onset  . Hypertension Mother   . Hypertension Father   . Hypertension Brother   . Colon polyps Neg Hx   . Colon cancer Neg Hx   . Esophageal cancer Neg Hx   . Rectal cancer Neg Hx   . Stomach cancer Neg Hx    Social History   Tobacco Use  . Smoking status: Never Smoker  . Smokeless tobacco: Never Used  Substance Use Topics  . Alcohol use: No    Alcohol/week: 0.0 standard drinks  . Drug use: No   Current Outpatient Medications  Medication Sig Dispense Refill  . buPROPion (WELLBUTRIN SR) 150 MG 12 hr tablet Take 150 mg by mouth daily.    . paliperidone (INVEGA SUSTENNA) 156 MG/ML SUSP injection Inject 156 mg into the muscle every 30 (thirty) days.    . traZODone (DESYREL) 100 MG tablet Take 200 mg by mouth at bedtime.     No current facility-administered medications for this visit.    No Known Allergies   Review of Systems: All systems reviewed and negative except where noted in HPI.    No results found.  Physical Exam: There  were no vitals taken for this visit. Constitutional: Pleasant,well-developed, ***female in no acute distress. HEENT: Normocephalic and atraumatic. Conjunctivae are normal. No scleral icterus. Neck supple.  Cardiovascular: Normal rate, regular rhythm.  Pulmonary/chest: Effort normal and breath sounds normal. No wheezing, rales or rhonchi. Abdominal: Soft, nondistended, nontender. Bowel sounds active throughout. There are no masses palpable. No hepatomegaly. Extremities: no edema Lymphadenopathy: No cervical adenopathy noted. Neurological: Alert and oriented to person place and time. Skin: Skin is warm  and dry. No rashes noted. Psychiatric: Normal mood and affect. Behavior is normal.   ASSESSMENT AND PLAN:  Illa Level, *

## 2019-08-04 ENCOUNTER — Other Ambulatory Visit: Payer: Self-pay

## 2019-08-04 ENCOUNTER — Ambulatory Visit
Admission: RE | Admit: 2019-08-04 | Discharge: 2019-08-04 | Disposition: A | Discharge status: Home / Self Care | Payer: Medicaid Other | Source: Ambulatory Visit | Attending: Internal Medicine | Admitting: Internal Medicine

## 2019-08-04 DIAGNOSIS — Z1231 Encounter for screening mammogram for malignant neoplasm of breast: Secondary | ICD-10-CM

## 2019-08-12 ENCOUNTER — Encounter: Payer: Self-pay | Admitting: Gastroenterology

## 2019-08-12 ENCOUNTER — Ambulatory Visit (INDEPENDENT_AMBULATORY_CARE_PROVIDER_SITE_OTHER): Payer: Medicaid Other | Admitting: Gastroenterology

## 2019-08-12 VITALS — BP 160/80 | HR 96 | Temp 98.2°F | Ht 60.25 in | Wt 237.0 lb

## 2019-08-12 DIAGNOSIS — D013 Carcinoma in situ of anus and anal canal: Secondary | ICD-10-CM | POA: Diagnosis not present

## 2019-08-12 DIAGNOSIS — Z860100 Personal history of colon polyps, unspecified: Secondary | ICD-10-CM | POA: Insufficient documentation

## 2019-08-12 DIAGNOSIS — Z8619 Personal history of other infectious and parasitic diseases: Secondary | ICD-10-CM

## 2019-08-12 DIAGNOSIS — K6282 Dysplasia of anus: Secondary | ICD-10-CM | POA: Insufficient documentation

## 2019-08-12 NOTE — Patient Instructions (Signed)
If you are age 63 or older, your body mass index should be between 23-30. Your Body mass index is 45.9 kg/m. If this is out of the aforementioned range listed, please consider follow up with your Primary Care Provider.  If you are age 51 or younger, your body mass index should be between 19-25. Your Body mass index is 45.9 kg/m. If this is out of the aformentioned range listed, please consider follow up with your Primary Care Provider.   Referral to [facility].

## 2019-08-12 NOTE — Progress Notes (Signed)
HPI :  63 year old female here for a follow-up visit.  I know her from prior screening colonoscopies, she also has a history of hepatitis C with eradication.  I performed her colonoscopy in 2017 at which time she had an advanced adenoma, 10 mm descending colon polyp.  She had a follow-up colonoscopy with me in October 2020.  She had 5 diminutive polyps removed, 2 of which were adenomatous, told to have a 5-year follow-up for surveillance of polyps.  Incidentally she also had changes in the rectum that returned consistent with AIN, this was new compared to her last colonoscopy.  We had recommended a referral to [facility] colorectal surgery for the AIN.  They tried contacting her but she did not make a follow-up appointment.  Our office tried to contact her but could not get through.  She is accompanied by her brother today, named Eligah East, he coordinates most of her health care and oversees her healthcare decisions.  He confirms that she has followed up with hepatology in recent years and was told she has eradicated hepatitis C and there is no evidence of liver disease  She otherwise feels well today without complaints.  She denies any abdominal pains.  No perianal symptoms such as pain, bleeding etc.     Past Medical History:  Diagnosis Date  . AIN (anal intraepithelial neoplasia) anal canal   . Anemia   . Depression   . Hepatitis C, chronic ([place])   . Hypertension   . Morbid obesity ([place])   . Schizophrenia [facility])      Past Surgical History:  Procedure Laterality Date  . COLONOSCOPY    . POLYPECTOMY    . VAGINAL HYSTERECTOMY     Family History  Problem Relation Age of Onset  . Hypertension Mother   . Hypertension Father   . Hypertension Brother   . Colon polyps Neg Hx   . Colon cancer Neg Hx   . Esophageal cancer Neg Hx   . Rectal cancer Neg Hx   . Stomach cancer Neg Hx    Social History   Tobacco Use  . Smoking status: Never Smoker  . Smokeless tobacco: Never Used    Substance Use Topics  . Alcohol use: No    Alcohol/week: 0.0 standard drinks  . Drug use: No   Current Outpatient Medications  Medication Sig Dispense Refill  . buPROPion (WELLBUTRIN SR) 150 MG 12 hr tablet Take 150 mg by mouth daily.    . paliperidone (INVEGA SUSTENNA) 156 MG/ML SUSP injection Inject 156 mg into the muscle every 30 (thirty) days.    . traZODone (DESYREL) 100 MG tablet Take 200 mg by mouth at bedtime.     No current facility-administered medications for this visit.   No Known Allergies   Review of Systems: All systems reviewed and negative except where noted in HPI.    Physical Exam: BP (!) 160/80 (BP Location: Left Arm, Patient Position: Sitting, Cuff Size: Large)   Pulse 96   Temp 98.2 F (36.8 C)   Ht 5' 0.25" (1.53 m) Comment: height measured without shoes  Wt 237 lb (107.5 kg)   BMI 45.90 kg/m  Constitutional: Pleasant,well-developed, female in no acute distress. HEENT: Normocephalic and atraumatic. Conjunctivae are normal. No scleral icterus. Neck supple.  Cardiovascular: Normal rate, regular rhythm.  Pulmonary/chest: Effort normal and breath sounds normal. No wheezing, rales or rhonchi. Abdominal: Soft, protuberant, nontender. There are no masses palpable. Extremities: no edema Lymphadenopathy: No cervical adenopathy noted. Neurological: Alert  and oriented to person place and time. Skin: Skin is warm and dry. No rashes noted. Psychiatric: Normal mood and affect. Behavior is normal.   ASSESSMENT AND PLAN: 63 year old female here for reassessment of the following:  AIN - this is a new development on her most recent colonoscopy.  I discussed what this is with her and risks for anal cancer if this is not treated.  She has not followed up with colorectal surgery as previously discussed.  I outlined the importance of this and discussed with her brother who states he will help coordinate this follow-up with the surgeons and she is agreeable to proceed.   Eligah East is a point of contact her brother, his phone number is [phone].  Referral placed to colorectal surgery  History of colon polyps - repeat colonoscopy in 5 years  History of hep C - eradicated  Peoria Cellar, MD [facility]

## 2019-12-29 ENCOUNTER — Telehealth: Payer: Self-pay

## 2019-12-29 NOTE — Telephone Encounter (Signed)
NOTES ON FILE FROM [facility] [phone], SENT REFERRAL TO SCHEDULING 

## 2020-01-05 ENCOUNTER — Encounter: Payer: Self-pay | Admitting: General Practice

## 2020-01-28 ENCOUNTER — Telehealth: Payer: Self-pay | Admitting: Cardiology

## 2020-01-28 NOTE — Telephone Encounter (Signed)
Spoke with frank, aware okay for 1 person to attend appointment.

## 2020-01-28 NOTE — Telephone Encounter (Signed)
° ° °  Pilar Plate calling, he said he or pt's daughter Barbaraann Share needs to come in with pt to help her and be there to hear pt's plan of care

## 2020-01-30 ENCOUNTER — Ambulatory Visit (INDEPENDENT_AMBULATORY_CARE_PROVIDER_SITE_OTHER): Payer: Medicaid Other | Admitting: Cardiology

## 2020-01-30 ENCOUNTER — Other Ambulatory Visit: Payer: Self-pay

## 2020-01-30 ENCOUNTER — Encounter: Payer: Self-pay | Admitting: Cardiology

## 2020-01-30 ENCOUNTER — Telehealth: Payer: Self-pay | Admitting: *Deleted

## 2020-01-30 VITALS — BP 140/78 | HR 92 | Temp 97.0°F | Ht 62.0 in | Wt 241.0 lb

## 2020-01-30 DIAGNOSIS — I10 Essential (primary) hypertension: Secondary | ICD-10-CM | POA: Diagnosis not present

## 2020-01-30 DIAGNOSIS — R0683 Snoring: Secondary | ICD-10-CM

## 2020-01-30 DIAGNOSIS — R011 Cardiac murmur, unspecified: Secondary | ICD-10-CM | POA: Diagnosis not present

## 2020-01-30 NOTE — Progress Notes (Signed)
Cardiology Office Note:    Date:  01/30/2020   ID:  Anjoli, Diemer [birth year], MRN [id number]  PCP:  Illa Level, NP  Cardiologist:  No primary care provider on file.  Electrophysiologist:  None   Referring MD: Boyce Medici, FNP   Chief Complaint  Patient presents with  . Heart Murmur    History of Present Illness:    Dana Ayala is a 64 y.o. female with a hx of hypertension, morbid obesity, hyperlipidemia, schizophrenia who is referred by Volney Presser, FNP for evaluation of heart murmur.  She reports that she is not very active, as the most exertion she does is walking around her home.  She denies any chest pain, dyspnea, lightheadedness, syncope, or palpitations.  Does report intermittent lower extremity edema.  States that she has been told she snores.  No smoking history.  Family history includes father had CABG in his 51s.  Recent labs on 11/27/2019 showed LDL 123, creatinine 1.05, A1c 6%, TSH 1.2, hemoglobin 11.5  Past Medical History:  Diagnosis Date  . AIN (anal intraepithelial neoplasia) anal canal   . Anemia   . Depression   . Hepatitis C, chronic ([place])   . Hypertension   . Morbid obesity ([place])   . Schizophrenia [facility])     Past Surgical History:  Procedure Laterality Date  . COLONOSCOPY    . POLYPECTOMY    . VAGINAL HYSTERECTOMY      Current Medications: Current Meds  Medication Sig  . buPROPion (WELLBUTRIN SR) 150 MG 12 hr tablet Take 150 mg by mouth daily.  . paliperidone (INVEGA SUSTENNA) 156 MG/ML SUSP injection Inject 156 mg into the muscle every 30 (thirty) days.  . traZODone (DESYREL) 100 MG tablet Take 200 mg by mouth at bedtime.     Allergies:   Patient has no known allergies.   Social History   Socioeconomic History  . Marital status: Widowed    Spouse name: Not on file  . Number of children: 1  . Years of education: Not on file  . Highest education level: Not on file  Occupational History  . Not  on file  Tobacco Use  . Smoking status: Never Smoker  . Smokeless tobacco: Never Used  Substance and Sexual Activity  . Alcohol use: No    Alcohol/week: 0.0 standard drinks  . Drug use: No  . Sexual activity: Not on file  Other Topics Concern  . Not on file  Social History Narrative  . Not on file   Social Determinants of Health   Financial Resource Strain:   . Difficulty of Paying Living Expenses:   Food Insecurity:   . Worried About Charity fundraiser in the Last Year:   . Arboriculturist in the Last Year:   Transportation Needs:   . Film/video editor (Medical):   Marland Kitchen Lack of Transportation (Non-Medical):   Physical Activity:   . Days of Exercise per Week:   . Minutes of Exercise per Session:   Stress:   . Feeling of Stress :   Social Connections:   . Frequency of Communication with Friends and Family:   . Frequency of Social Gatherings with Friends and Family:   . Attends Religious Services:   . Active Member of Clubs or Organizations:   . Attends Archivist Meetings:   Marland Kitchen Marital Status:      Family History: The patient's family history includes Hypertension in her brother, father, and mother.  There is no history of Colon polyps, Colon cancer, Esophageal cancer, Rectal cancer, or Stomach cancer.  ROS:   Please see the history of present illness.    All other systems reviewed and are negative.  EKGs/Labs/Other Studies Reviewed:    The following studies were reviewed today:   EKG:  EKG is  ordered today.  The ekg ordered today demonstrates normal sinus rhythm, rate 92, nonspecific T wave flattening   Recent Labs: No results found for requested labs within last 8760 hours.  Recent Lipid Panel    Component Value Date/Time   CHOL 139 02/05/2009 0000   TRIG 72 02/05/2009 0000   HDL 57 02/05/2009 0000   CHOLHDL 2.4 Ratio 02/05/2009 0000   VLDL 14 02/05/2009 0000   LDLCALC 68 02/05/2009 0000    Physical Exam:    VS:  BP 140/78   Pulse 92    Temp (!) 97 F (36.1 C)   Ht 5\' 2"  (1.575 m)   Wt 241 lb (109.3 kg)   SpO2 98%   BMI 44.08 kg/m     Wt Readings from Last 3 Encounters:  01/30/20 241 lb (109.3 kg)  08/12/19 237 lb (107.5 kg)  05/27/19 230 lb (104.3 kg)     GEN:  in no acute distress HEENT: Normal NECK: No JVD; No carotid bruits LYMPHATICS: No lymphadenopathy CARDIAC: RRR, 2 out of 6 systolic murmur loudest at left upper sternal border RESPIRATORY:  Clear to auscultation without rales, wheezing or rhonchi  ABDOMEN: Soft, non-tender, non-distended MUSCULOSKELETAL:  No edema; No deformity  SKIN: Warm and dry NEUROLOGIC:  Alert and oriented x 3 PSYCHIATRIC:  Normal affect   ASSESSMENT:    1. Heart murmur   2. Hypertension, unspecified type   3. Morbid obesity ([place])   4. Snoring    PLAN:    Heart murmur: 2/6 systolic murmur on exam, will evaluate further with echocardiogram  Hypertension: On lisinopril 30 mg daily, chlorthalidone 25 mg daily, and amlodipine 10 mg daily  Hyperlipidemia: On rosuvastatin 10 mg daily.  LDL 123 on 11/27/2019  Snoring: Will order sleep study  RTC in 1 to 2 months   Medication Adjustments/Labs and Tests Ordered: Current medicines are reviewed at length with the patient today.  Concerns regarding medicines are outlined above.  Orders Placed This Encounter  Procedures  . EKG 12-Lead  . ECHOCARDIOGRAM COMPLETE  . Split night study   No orders of the defined types were placed in this encounter.   Patient Instructions  Medication Instructions:  Your physician recommends that you continue on your current medications as directed. Please refer to the Current Medication list given to you today.  Testing/Procedures: Your physician has requested that you have an echocardiogram. Echocardiography is a painless test that uses sound waves to create images of your heart. It provides your doctor with information about the size and shape of your heart and how well your heart's  chambers and valves are working. This procedure takes approximately one hour. There are no restrictions for this procedure. This will be done at our [facility] location:  [place] has recommended that you have a sleep study. This test records several body functions during sleep, including: brain activity, eye movement, oxygen and carbon dioxide blood levels, heart rate and rhythm, breathing rate and rhythm, the flow of air through your mouth and nose, snoring, body muscle movements, and chest and belly movement. --this must be approved by insurance prior to  scheduling   Follow-Up: At [facility], you and your health needs are our priority.  As part of our continuing mission to provide you with exceptional heart care, we have created designated Provider Care Teams.  These Care Teams include your primary Cardiologist (physician) and Advanced Practice Providers (APPs -  Physician Assistants and Nurse Practitioners) who all work together to provide you with the care you need, when you need it.  We recommend signing up for the patient portal called "MyChart".  Sign up information is provided on this After Visit Summary.  MyChart is used to connect with patients for Virtual Visits (Telemedicine).  Patients are able to view lab/test results, encounter notes, upcoming appointments, etc.  Non-urgent messages can be sent to your provider as well.   To learn more about what you can do with MyChart, go to [email or web address].    Your next appointment:   1-2 month(s)  The format for your next appointment:   In Person  Provider:   Oswaldo Milian, MD      Signed, Donato Heinz, MD  01/30/2020 4:53 PM    [place]

## 2020-01-30 NOTE — Patient Instructions (Signed)
Medication Instructions:  Your physician recommends that you continue on your current medications as directed. Please refer to the Current Medication list given to you today.  Testing/Procedures: Your physician has requested that you have an echocardiogram. Echocardiography is a painless test that uses sound waves to create images of your heart. It provides your doctor with information about the size and shape of your heart and how well your heart's chambers and valves are working. This procedure takes approximately one hour. There are no restrictions for this procedure. This will be done at our [facility] location:  [place] has recommended that you have a sleep study. This test records several body functions during sleep, including: brain activity, eye movement, oxygen and carbon dioxide blood levels, heart rate and rhythm, breathing rate and rhythm, the flow of air through your mouth and nose, snoring, body muscle movements, and chest and belly movement. --this must be approved by insurance prior to scheduling   Follow-Up: At [facility], you and your health needs are our priority.  As part of our continuing mission to provide you with exceptional heart care, we have created designated Provider Care Teams.  These Care Teams include your primary Cardiologist (physician) and Advanced Practice Providers (APPs -  Physician Assistants and Nurse Practitioners) who all work together to provide you with the care you need, when you need it.  We recommend signing up for the patient portal called "MyChart".  Sign up information is provided on this After Visit Summary.  MyChart is used to connect with patients for Virtual Visits (Telemedicine).  Patients are able to view lab/test results, encounter notes, upcoming appointments, etc.  Non-urgent messages can be sent to your provider as well.   To learn more about what you can do with MyChart, go to  [email or web address].    Your next appointment:   1-2 month(s)  The format for your next appointment:   In Person  Provider:   Oswaldo Milian, MD

## 2020-01-30 NOTE — Telephone Encounter (Signed)
Patient's brother notified of sleep study and Covid appointment details.

## 2020-02-02 ENCOUNTER — Ambulatory Visit (HOSPITAL_COMMUNITY): Payer: Medicaid Other | Attending: Cardiology

## 2020-02-02 ENCOUNTER — Other Ambulatory Visit: Payer: Self-pay

## 2020-02-02 DIAGNOSIS — R011 Cardiac murmur, unspecified: Secondary | ICD-10-CM | POA: Insufficient documentation

## 2020-02-20 ENCOUNTER — Other Ambulatory Visit (HOSPITAL_COMMUNITY): Payer: Medicaid Other

## 2020-02-22 ENCOUNTER — Encounter (HOSPITAL_BASED_OUTPATIENT_CLINIC_OR_DEPARTMENT_OTHER): Payer: Medicaid Other | Admitting: Cardiovascular Disease

## 2020-03-02 ENCOUNTER — Encounter: Payer: Self-pay | Admitting: Cardiology

## 2020-03-02 ENCOUNTER — Telehealth: Payer: Self-pay | Admitting: *Deleted

## 2020-03-02 ENCOUNTER — Other Ambulatory Visit: Payer: Self-pay

## 2020-03-02 ENCOUNTER — Ambulatory Visit (INDEPENDENT_AMBULATORY_CARE_PROVIDER_SITE_OTHER): Payer: Medicaid Other | Admitting: Cardiology

## 2020-03-02 VITALS — BP 138/62 | HR 115 | Ht 62.0 in | Wt 240.6 lb

## 2020-03-02 DIAGNOSIS — I10 Essential (primary) hypertension: Secondary | ICD-10-CM | POA: Diagnosis not present

## 2020-03-02 DIAGNOSIS — R0683 Snoring: Secondary | ICD-10-CM | POA: Diagnosis not present

## 2020-03-02 DIAGNOSIS — R011 Cardiac murmur, unspecified: Secondary | ICD-10-CM

## 2020-03-02 DIAGNOSIS — E785 Hyperlipidemia, unspecified: Secondary | ICD-10-CM

## 2020-03-02 NOTE — Telephone Encounter (Signed)
Patient notified of new sleep study appointment.

## 2020-03-02 NOTE — Patient Instructions (Signed)
Medication Instructions:  Your physician recommends that you continue on your current medications as directed. Please refer to the Current Medication list given to you today.  *If you need a refill on your cardiac medications before your next appointment, please call your pharmacy*  Testing/Procedures: CT coronary calcium score. This test is done at [address]. This is $150 out of pocket.   Coronary CalciumScan A coronary calcium scan is an imaging test used to look for deposits of calcium and other fatty materials (plaques) in the inner lining of the blood vessels of the heart (coronary arteries). These deposits of calcium and plaques can partly clog and narrow the coronary arteries without producing any symptoms or warning signs. This puts a person at risk for a heart attack. This test can detect these deposits before symptoms develop. Tell a health care provider about:  Any allergies you have.  All medicines you are taking, including vitamins, herbs, eye drops, creams, and over-the-counter medicines.  Any problems you or family members have had with anesthetic medicines.  Any blood disorders you have.  Any surgeries you have had.  Any medical conditions you have.  Whether you are pregnant or may be pregnant. What are the risks? Generally, this is a safe procedure. However, problems may occur, including:  Harm to a pregnant woman and her unborn baby. This test involves the use of radiation. Radiation exposure can be dangerous to a pregnant woman and her unborn baby. If you are pregnant, you generally should not have this procedure done.  Slight increase in the risk of cancer. This is because of the radiation involved in the test. What happens before the procedure? No preparation is needed for this procedure. What happens during the procedure?  You will undress and remove any jewelry around your neck or chest.  You will put on a hospital gown.  Sticky  electrodes will be placed on your chest. The electrodes will be connected to an electrocardiogram (ECG) machine to record a tracing of the electrical activity of your heart.  A CT scanner will take pictures of your heart. During this time, you will be asked to lie still and hold your breath for 2-3 seconds while a picture of your heart is being taken. The procedure may vary among health care providers and hospitals. What happens after the procedure?  You can get dressed.  You can return to your normal activities.  It is up to you to get the results of your test. Ask your health care provider, or the department that is doing the test, when your results will be ready. Summary  A coronary calcium scan is an imaging test used to look for deposits of calcium and other fatty materials (plaques) in the inner lining of the blood vessels of the heart (coronary arteries).  Generally, this is a safe procedure. Tell your health care provider if you are pregnant or may be pregnant.  No preparation is needed for this procedure.  A CT scanner will take pictures of your heart.  You can return to your normal activities after the scan is done. This information is not intended to replace advice given to you by your health care provider. Make sure you discuss any questions you have with your health care provider. Document Released: 01/27/2008 Document Revised: 06/19/2016 Document Reviewed: 06/19/2016 Elsevier Interactive Patient Education  2017 [employer].   ---reschedule sleep study  Follow-Up: At [facility], you and your health needs are our priority.  As part of our continuing mission to provide you with exceptional heart care, we have created designated Provider Care Teams.  These Care Teams include your primary Cardiologist (physician) and Advanced Practice Providers (APPs -  Physician Assistants and Nurse Practitioners) who all work together to provide you with the care you need, when you need  it.  We recommend signing up for the patient portal called "MyChart".  Sign up information is provided on this After Visit Summary.  MyChart is used to connect with patients for Virtual Visits (Telemedicine).  Patients are able to view lab/test results, encounter notes, upcoming appointments, etc.  Non-urgent messages can be sent to your provider as well.   To learn more about what you can do with MyChart, go to [email or web address].    Your next appointment:   12 month(s)  The format for your next appointment:   In Person  Provider:   Oswaldo Milian, MD

## 2020-03-02 NOTE — Progress Notes (Signed)
Cardiology Office Note:    Date:  03/02/2020   ID:  Dana, Ayala [birth year], MRN [id number]  PCP:  Illa Level, NP  Cardiologist:  No primary care provider on file.  Electrophysiologist:  None   Referring MD: Illa Level, *   Chief Complaint  Patient presents with  . Follow-up    17months  . Heart Murmur    History of Present Illness:    Dana Ayala is a 64 y.o. female with a hx of hypertension, morbid obesity, hyperlipidemia, schizophrenia who presents for follow-up.  She was referred by Volney Presser, FNP for evaluation of heart murmur, initially seen on 01/30/2020.  She reports that she is not very active, as the most exertion she does is walking around her home.  She denies any chest pain, dyspnea, lightheadedness, syncope, or palpitations.  Does report intermittent lower extremity edema.  States that she has been told she snores.  No smoking history.  Family history includes father had CABG in his 72s.  Recent labs on 11/27/2019 showed LDL 123, creatinine 1.05, A1c 6%, TSH 1.2, hemoglobin 11.5.  Echocardiogram on 02/02/2020 showed normal biventricular function, grade 1 diastolic dysfunction, no significant valvular disease.  Since last clinic visit, she reports that she has been doing well.  She denies any chest pain, dyspnea, lightheadedness, syncope, palpitations, or lower extremity edema.  Does not check her blood pressure at home.  Past Medical History:  Diagnosis Date  . AIN (anal intraepithelial neoplasia) anal canal   . Anemia   . Depression   . Hepatitis C, chronic ([place])   . Hypertension   . Morbid obesity ([place])   . Schizophrenia [facility])     Past Surgical History:  Procedure Laterality Date  . COLONOSCOPY    . POLYPECTOMY    . VAGINAL HYSTERECTOMY      Current Medications: Current Meds  Medication Sig  . buPROPion (WELLBUTRIN SR) 150 MG 12 hr tablet Take 150 mg by mouth daily.  . paliperidone (INVEGA  SUSTENNA) 156 MG/ML SUSP injection Inject 156 mg into the muscle every 30 (thirty) days.  . traZODone (DESYREL) 100 MG tablet Take 200 mg by mouth at bedtime.     Allergies:   Patient has no known allergies.   Social History   Socioeconomic History  . Marital status: Widowed    Spouse name: Not on file  . Number of children: 1  . Years of education: Not on file  . Highest education level: Not on file  Occupational History  . Not on file  Tobacco Use  . Smoking status: Never Smoker  . Smokeless tobacco: Never Used  Substance and Sexual Activity  . Alcohol use: No    Alcohol/week: 0.0 standard drinks  . Drug use: No  . Sexual activity: Not on file  Other Topics Concern  . Not on file  Social History Narrative  . Not on file   Social Determinants of Health   Financial Resource Strain:   . Difficulty of Paying Living Expenses:   Food Insecurity:   . Worried About Charity fundraiser in the Last Year:   . Arboriculturist in the Last Year:   Transportation Needs:   . Film/video editor (Medical):   Marland Kitchen Lack of Transportation (Non-Medical):   Physical Activity:   . Days of Exercise per Week:   . Minutes of Exercise per Session:   Stress:   . Feeling of Stress :   Social Connections:   .  Frequency of Communication with Friends and Family:   . Frequency of Social Gatherings with Friends and Family:   . Attends Religious Services:   . Active Member of Clubs or Organizations:   . Attends Archivist Meetings:   Marland Kitchen Marital Status:      Family History: The patient's family history includes Hypertension in her brother, father, and mother. There is no history of Colon polyps, Colon cancer, Esophageal cancer, Rectal cancer, or Stomach cancer.  ROS:   Please see the history of present illness.    All other systems reviewed and are negative.  EKGs/Labs/Other Studies Reviewed:    The following studies were reviewed today:   EKG:  EKG is  Not ordered today.  The  ekg ordered most recently demonstrates normal sinus rhythm, rate 92, nonspecific T wave flattening   Recent Labs: No results found for requested labs within last 8760 hours.  Recent Lipid Panel    Component Value Date/Time   CHOL 139 02/05/2009 0000   TRIG 72 02/05/2009 0000   HDL 57 02/05/2009 0000   CHOLHDL 2.4 Ratio 02/05/2009 0000   VLDL 14 02/05/2009 0000   LDLCALC 68 02/05/2009 0000    Physical Exam:    VS:  BP 138/62 (BP Location: Right Arm, Patient Position: Sitting, Cuff Size: Large)   Pulse (!) 115   Ht 5\' 2"  (1.575 m)   Wt 240 lb 9.6 oz (109.1 kg)   SpO2 97%   BMI 44.01 kg/m     Wt Readings from Last 3 Encounters:  03/02/20 240 lb 9.6 oz (109.1 kg)  01/30/20 241 lb (109.3 kg)  08/12/19 237 lb (107.5 kg)     GEN:  in no acute distress HEENT: Normal NECK: No JVD CARDIAC: RRR, 2 out of 6 systolic murmur loudest at left upper sternal border RESPIRATORY:  Clear to auscultation without rales, wheezing or rhonchi  ABDOMEN: Soft, non-tender, non-distended MUSCULOSKELETAL:  No edema; No deformity  SKIN: Warm and dry NEUROLOGIC:  Alert and oriented x 3 PSYCHIATRIC:  Normal affect   ASSESSMENT:    1. Hyperlipidemia, unspecified hyperlipidemia type   2. Heart murmur   3. Hypertension, unspecified type   4. Snoring    PLAN:    Heart murmur: 2/6 systolic murmur on exam.  Echocardiogram on 02/02/2020 showed normal biventricular function, grade 1 diastolic dysfunction, no significant valvular disease.  Hypertension: On lisinopril 30 mg daily, chlorthalidone 25 mg daily, and amlodipine 10 mg daily.  Appears controlled  Hyperlipidemia: On rosuvastatin 10 mg daily.  LDL 123 on 11/27/2019, will check calcium score for further risk stratification  Snoring: Sleep study pending  RTC in 1 year   Medication Adjustments/Labs and Tests Ordered: Current medicines are reviewed at length with the patient today.  Concerns regarding medicines are outlined above.  Orders  Placed This Encounter  Procedures  . CT CARDIAC SCORING   No orders of the defined types were placed in this encounter.   Patient Instructions  Medication Instructions:  Your physician recommends that you continue on your current medications as directed. Please refer to the Current Medication list given to you today.  *If you need a refill on your cardiac medications before your next appointment, please call your pharmacy*  Testing/Procedures: CT coronary calcium score. This test is done at [address]. This is $150 out of pocket.   Coronary CalciumScan A coronary calcium scan is an imaging test used to look for deposits of calcium and other fatty  materials (plaques) in the inner lining of the blood vessels of the heart (coronary arteries). These deposits of calcium and plaques can partly clog and narrow the coronary arteries without producing any symptoms or warning signs. This puts a person at risk for a heart attack. This test can detect these deposits before symptoms develop. Tell a health care provider about:  Any allergies you have.  All medicines you are taking, including vitamins, herbs, eye drops, creams, and over-the-counter medicines.  Any problems you or family members have had with anesthetic medicines.  Any blood disorders you have.  Any surgeries you have had.  Any medical conditions you have.  Whether you are pregnant or may be pregnant. What are the risks? Generally, this is a safe procedure. However, problems may occur, including:  Harm to a pregnant woman and her unborn baby. This test involves the use of radiation. Radiation exposure can be dangerous to a pregnant woman and her unborn baby. If you are pregnant, you generally should not have this procedure done.  Slight increase in the risk of cancer. This is because of the radiation involved in the test. What happens before the procedure? No preparation is needed for this  procedure. What happens during the procedure?  You will undress and remove any jewelry around your neck or chest.  You will put on a hospital gown.  Sticky electrodes will be placed on your chest. The electrodes will be connected to an electrocardiogram (ECG) machine to record a tracing of the electrical activity of your heart.  A CT scanner will take pictures of your heart. During this time, you will be asked to lie still and hold your breath for 2-3 seconds while a picture of your heart is being taken. The procedure may vary among health care providers and hospitals. What happens after the procedure?  You can get dressed.  You can return to your normal activities.  It is up to you to get the results of your test. Ask your health care provider, or the department that is doing the test, when your results will be ready. Summary  A coronary calcium scan is an imaging test used to look for deposits of calcium and other fatty materials (plaques) in the inner lining of the blood vessels of the heart (coronary arteries).  Generally, this is a safe procedure. Tell your health care provider if you are pregnant or may be pregnant.  No preparation is needed for this procedure.  A CT scanner will take pictures of your heart.  You can return to your normal activities after the scan is done. This information is not intended to replace advice given to you by your health care provider. Make sure you discuss any questions you have with your health care provider. Document Released: 01/27/2008 Document Revised: 06/19/2016 Document Reviewed: 06/19/2016 Elsevier Interactive Patient Education  2017 [employer].   ---reschedule sleep study  Follow-Up: At [facility], you and your health needs are our priority.  As part of our continuing mission to provide you with exceptional heart care, we have created designated Provider Care Teams.  These Care Teams include your primary Cardiologist  (physician) and Advanced Practice Providers (APPs -  Physician Assistants and Nurse Practitioners) who all work together to provide you with the care you need, when you need it.  We recommend signing up for the patient portal called "MyChart".  Sign up information is provided on this After Visit Summary.  MyChart is used to connect with patients for  Virtual Visits (Telemedicine).  Patients are able to view lab/test results, encounter notes, upcoming appointments, etc.  Non-urgent messages can be sent to your provider as well.   To learn more about what you can do with MyChart, go to [email or web address].    Your next appointment:   12 month(s)  The format for your next appointment:   In Person  Provider:   Oswaldo Milian, MD      Signed, Donato Heinz, MD  03/02/2020 8:25 AM    [place]

## 2020-03-11 ENCOUNTER — Ambulatory Visit (INDEPENDENT_AMBULATORY_CARE_PROVIDER_SITE_OTHER)
Admission: RE | Admit: 2020-03-11 | Discharge: 2020-03-11 | Disposition: A | Discharge status: Home / Self Care | Payer: Self-pay | Source: Ambulatory Visit | Attending: Cardiology | Admitting: Cardiology

## 2020-03-11 ENCOUNTER — Other Ambulatory Visit: Payer: Self-pay

## 2020-03-11 DIAGNOSIS — E785 Hyperlipidemia, unspecified: Secondary | ICD-10-CM

## 2020-03-19 ENCOUNTER — Other Ambulatory Visit: Payer: Self-pay

## 2020-03-19 MED ORDER — ROSUVASTATIN CALCIUM 10 MG PO TABS: 10.0000 mg | ORAL_TABLET | Freq: Every day | ORAL | 3 refills | Status: DC

## 2020-03-24 ENCOUNTER — Encounter (HOSPITAL_BASED_OUTPATIENT_CLINIC_OR_DEPARTMENT_OTHER): Payer: Medicaid Other | Admitting: Cardiovascular Disease

## 2020-09-08 ENCOUNTER — Other Ambulatory Visit: Payer: Self-pay | Admitting: Internal Medicine

## 2020-09-08 DIAGNOSIS — Z1231 Encounter for screening mammogram for malignant neoplasm of breast: Secondary | ICD-10-CM

## 2020-10-21 ENCOUNTER — Other Ambulatory Visit: Payer: Self-pay

## 2020-10-21 ENCOUNTER — Ambulatory Visit
Admission: RE | Admit: 2020-10-21 | Discharge: 2020-10-21 | Disposition: A | Discharge status: Home / Self Care | Payer: Medicaid Other | Source: Ambulatory Visit | Attending: Internal Medicine | Admitting: Internal Medicine

## 2020-10-21 DIAGNOSIS — Z1231 Encounter for screening mammogram for malignant neoplasm of breast: Secondary | ICD-10-CM

## 2021-04-06 ENCOUNTER — Ambulatory Visit: Payer: Medicaid Other | Admitting: Cardiology

## 2021-04-07 NOTE — Progress Notes (Signed)
Cardiology Office Note:    Date:  04/08/2021   ID:  Dana Ayala, DOB 05-Feb-1956, MRN [id number]  PCP:  Illa Level, NP  Cardiologist:  None  Electrophysiologist:  None   Referring MD: Illa Level, *   Chief Complaint  Patient presents with   Hypertension     History of Present Illness:    Dana Ayala is a 65 y.o. female with a hx of hypertension, morbid obesity, hyperlipidemia, schizophrenia who presents for follow-up.  She was referred by Volney Presser, FNP for evaluation of heart murmur, initially seen on 01/30/2020.  She reports that she is not very active, as the most exertion she does is walking around her home.  She denies any chest pain, dyspnea, lightheadedness, syncope, or palpitations.  Does report intermittent lower extremity edema.  States that she has been told she snores.  No smoking history.  Family history includes father had CABG in his 24s.  Echocardiogram on 02/02/2020 showed normal biventricular function, grade 1 diastolic dysfunction, no significant valvular disease.  Calcium score 6 on 03/11/2020 (70th percentile)  Since last clinic visit, denies any chest pain, dyspnea, syncope, lower extremity edema, or palpitations.  Reports some lightheadedness after taking her medication.  She walks twice per week for 3 to 4 hours.   Past Medical History:  Diagnosis Date   AIN (anal intraepithelial neoplasia) anal canal    Anemia    Depression    Hepatitis C, chronic ([facility])    Hypertension    Morbid obesity ([place])    Schizophrenia ([place])     Past Surgical History:  Procedure Laterality Date   COLONOSCOPY     POLYPECTOMY     VAGINAL HYSTERECTOMY      Current Medications: Current Meds  Medication Sig   amLODipine (NORVASC) 10 MG tablet Take 10 mg by mouth daily.   buPROPion (WELLBUTRIN SR) 150 MG 12 hr tablet Take 150 mg by mouth daily.   paliperidone (INVEGA SUSTENNA) 156 MG/ML SUSP injection Inject 156 mg into the muscle every 30  (thirty) days.   traZODone (DESYREL) 100 MG tablet Take 200 mg by mouth at bedtime.     Allergies:   Patient has no known allergies.   Social History   Socioeconomic History   Marital status: Widowed    Spouse name: Not on file   Number of children: 1   Years of education: Not on file   Highest education level: Not on file  Occupational History   Not on file  Tobacco Use   Smoking status: Never   Smokeless tobacco: Never  Substance and Sexual Activity   Alcohol use: No    Alcohol/week: 0.0 standard drinks   Drug use: No   Sexual activity: Not on file  Other Topics Concern   Not on file  Social History Narrative   Not on file   Social Determinants of Health   Financial Resource Strain: Not on file  Food Insecurity: Not on file  Transportation Needs: Not on file  Physical Activity: Not on file  Stress: Not on file  Social Connections: Not on file     Family History: The patient's family history includes Hypertension in her brother, father, and mother. There is no history of Colon polyps, Colon cancer, Esophageal cancer, Rectal cancer, or Stomach cancer.  ROS:   Please see the history of present illness.    All other systems reviewed and are negative.  EKGs/Labs/Other Studies Reviewed:    The following studies were reviewed  today:   EKG:  EKG is  ordered today.  The ekg ordered demonstrates normal sinus rhythm, rate 73, nonspecific T wave flattening   Recent Labs: No results found for requested labs within last 8760 hours.  Recent Lipid Panel    Component Value Date/Time   CHOL 139 02/05/2009 0000   TRIG 72 02/05/2009 0000   HDL 57 02/05/2009 0000   CHOLHDL 2.4 Ratio 02/05/2009 0000   VLDL 14 02/05/2009 0000   LDLCALC 68 02/05/2009 0000    Physical Exam:    VS:  BP (!) 146/91   Pulse 73   Ht '5\' 2"'$  (1.575 m)   Wt 215 lb 12.8 oz (97.9 kg)   SpO2 96%   BMI 39.47 kg/m     Wt Readings from Last 3 Encounters:  04/08/21 215 lb 12.8 oz (97.9 kg)   03/02/20 240 lb 9.6 oz (109.1 kg)  01/30/20 241 lb (109.3 kg)     GEN:  in no acute distress HEENT: Normal NECK: No JVD CARDIAC: RRR, no murmur heard RESPIRATORY:  Clear to auscultation without rales, wheezing or rhonchi  ABDOMEN: Soft, non-tender, non-distended MUSCULOSKELETAL:  No edema; No deformity  SKIN: Warm and dry NEUROLOGIC:  Alert and oriented x 3 PSYCHIATRIC:  Normal affect   ASSESSMENT:    1. Heart murmur   2. Hypertension, unspecified type   3. Hyperlipidemia, unspecified hyperlipidemia type   4. Snoring     PLAN:    Heart murmur: Previously with 2/6 systolic murmur on exam, there are no murmur on exam today.  Echocardiogram on 02/02/2020 showed normal biventricular function, grade 1 diastolic dysfunction, no significant valvular disease.  Hypertension: Previously was on lisinopril 30 mg daily, chlorthalidone 25 mg daily, and amlodipine 10 mg daily.  She is not sure what she is taking now.  BP elevated in clinic, asked to call and let us know what her BP meds are.  Check CMP  Hyperlipidemia: LDL 123 on 11/27/2019, Calcium score 6 on 03/11/2020 (70th percentile).  Started rosuvastatin 10 mg daily.  We will recheck lipid panel.  Snoring: Sleep study ordered last year but was not done.  Agreeable to having done now  RTC in 1 year   Medication Adjustments/Labs and Tests Ordered: Current medicines are reviewed at length with the patient today.  Concerns regarding medicines are outlined above.  No orders of the defined types were placed in this encounter.  No orders of the defined types were placed in this encounter.   Patient Instructions  Medication Instructions:  Please call when you get home to let us know what blood pressure medication you are taking  *If you need a refill on your cardiac medications before your next appointment, please call your pharmacy*   Lab Work: CMET, CBC, Lipid, TSH  If you have labs (blood work) drawn today and your tests are  completely normal, you will receive your results only by: [place] (if you have MyChart) OR A paper copy in the mail If you have any lab test that is abnormal or we need to change your treatment, we will call you to review the results.   Testing/Procedures: Your physician has recommended that you have a sleep study. This test records several body functions during sleep, including: brain activity, eye movement, oxygen and carbon dioxide blood levels, heart rate and rhythm, breathing rate and rhythm, the flow of air through your mouth and nose, snoring, body muscle movements, and chest and belly movement.  Follow-Up: At [facility],  you and your health needs are our priority.  As part of our continuing mission to provide you with exceptional heart care, we have created designated Provider Care Teams.  These Care Teams include your primary Cardiologist (physician) and Advanced Practice Providers (APPs -  Physician Assistants and Nurse Practitioners) who all work together to provide you with the care you need, when you need it.  We recommend signing up for the patient portal called "MyChart".  Sign up information is provided on this After Visit Summary.  MyChart is used to connect with patients for Virtual Visits (Telemedicine).  Patients are able to view lab/test results, encounter notes, upcoming appointments, etc.  Non-urgent messages can be sent to your provider as well.   To learn more about what you can do with MyChart, go to [email or web address].    Your next appointment:   12 month(s)  The format for your next appointment:   In Person  Provider:   Oswaldo Milian, MD      Signed, Donato Heinz, MD  04/08/2021 8:54 AM    [place]

## 2021-04-08 ENCOUNTER — Ambulatory Visit (INDEPENDENT_AMBULATORY_CARE_PROVIDER_SITE_OTHER): Payer: Medicaid Other | Admitting: Cardiology

## 2021-04-08 ENCOUNTER — Other Ambulatory Visit: Payer: Self-pay

## 2021-04-08 ENCOUNTER — Encounter: Payer: Self-pay | Admitting: Cardiology

## 2021-04-08 VITALS — BP 146/91 | HR 73 | Ht 62.0 in | Wt 215.8 lb

## 2021-04-08 DIAGNOSIS — E785 Hyperlipidemia, unspecified: Secondary | ICD-10-CM | POA: Diagnosis not present

## 2021-04-08 DIAGNOSIS — R011 Cardiac murmur, unspecified: Secondary | ICD-10-CM | POA: Diagnosis not present

## 2021-04-08 DIAGNOSIS — R0683 Snoring: Secondary | ICD-10-CM | POA: Diagnosis not present

## 2021-04-08 DIAGNOSIS — I10 Essential (primary) hypertension: Secondary | ICD-10-CM | POA: Diagnosis not present

## 2021-04-08 NOTE — Patient Instructions (Signed)
Medication Instructions:  Please call when you get home to let us know what blood pressure medication you are taking  *If you need a refill on your cardiac medications before your next appointment, please call your pharmacy*   Lab Work: CMET, CBC, Lipid, TSH  If you have labs (blood work) drawn today and your tests are completely normal, you will receive your results only by: [place] (if you have MyChart) OR A paper copy in the mail If you have any lab test that is abnormal or we need to change your treatment, we will call you to review the results.   Testing/Procedures: Your physician has recommended that you have a sleep study. This test records several body functions during sleep, including: brain activity, eye movement, oxygen and carbon dioxide blood levels, heart rate and rhythm, breathing rate and rhythm, the flow of air through your mouth and nose, snoring, body muscle movements, and chest and belly movement.  Follow-Up: At [facility], you and your health needs are our priority.  As part of our continuing mission to provide you with exceptional heart care, we have created designated Provider Care Teams.  These Care Teams include your primary Cardiologist (physician) and Advanced Practice Providers (APPs -  Physician Assistants and Nurse Practitioners) who all work together to provide you with the care you need, when you need it.  We recommend signing up for the patient portal called "MyChart".  Sign up information is provided on this After Visit Summary.  MyChart is used to connect with patients for Virtual Visits (Telemedicine).  Patients are able to view lab/test results, encounter notes, upcoming appointments, etc.  Non-urgent messages can be sent to your provider as well.   To learn more about what you can do with MyChart, go to [email or web address].    Your next appointment:   12 month(s)  The format for your next appointment:   In Person  Provider:    Oswaldo Milian, MD

## 2021-04-08 NOTE — Addendum Note (Signed)
Addended by: Patria Mane A on: 04/08/2021 08:58 AM   Modules accepted: Orders

## 2021-04-09 LAB — TSH: TSH: 1.13 u[IU]/mL (ref 0.450–4.500)

## 2021-04-09 LAB — COMPREHENSIVE METABOLIC PANEL
ALT: 21 IU/L (ref 0–32)
AST: 30 IU/L (ref 0–40)
Albumin/Globulin Ratio: 1.3 (ref 1.2–2.2)
Albumin: 4.7 g/dL (ref 3.8–4.8)
Alkaline Phosphatase: 76 IU/L (ref 44–121)
BUN/Creatinine Ratio: 13 (ref 12–28)
BUN: 15 mg/dL (ref 8–27)
Bilirubin Total: 0.2 mg/dL (ref 0.0–1.2)
CO2: 25 mmol/L (ref 20–29)
Calcium: 9.9 mg/dL (ref 8.7–10.3)
Chloride: 100 mmol/L (ref 96–106)
Creatinine, Ser: 1.17 mg/dL — ABNORMAL HIGH (ref 0.57–1.00)
Globulin, Total: 3.6 g/dL (ref 1.5–4.5)
Glucose: 98 mg/dL (ref 65–99)
Potassium: 3.6 mmol/L (ref 3.5–5.2)
Sodium: 141 mmol/L (ref 134–144)
Total Protein: 8.3 g/dL (ref 6.0–8.5)
eGFR: 52 mL/min/{1.73_m2} — ABNORMAL LOW (ref >59)

## 2021-04-09 LAB — LIPID PANEL
Chol/HDL Ratio: 2.4 ratio (ref 0.0–4.4)
Cholesterol, Total: 106 mg/dL (ref 100–199)
HDL: 45 mg/dL (ref >39)
LDL Chol Calc (NIH): 47 mg/dL (ref 0–99)
Triglycerides: 66 mg/dL (ref 0–149)
VLDL Cholesterol Cal: 14 mg/dL (ref 5–40)

## 2021-04-09 LAB — CBC
Hematocrit: 35.1 % (ref 34.0–46.6)
Hemoglobin: 11.2 g/dL (ref 11.1–15.9)
MCH: 23.3 pg — ABNORMAL LOW (ref 26.6–33.0)
MCHC: 31.9 g/dL (ref 31.5–35.7)
MCV: 73 fL — ABNORMAL LOW (ref 79–97)
Platelets: 223 10*3/uL (ref 150–450)
RBC: 4.8 x10E6/uL (ref 3.77–5.28)
RDW: 15.3 % (ref 11.7–15.4)
WBC: 8.7 10*3/uL (ref 3.4–10.8)

## 2021-04-11 ENCOUNTER — Telehealth (HOSPITAL_BASED_OUTPATIENT_CLINIC_OR_DEPARTMENT_OTHER): Payer: Self-pay

## 2021-04-11 NOTE — Telephone Encounter (Signed)
Pt's brother called to updated Dr. Gardiner Rhyme regarding pt's medication list and noted she is taking the following listed below.   Chlorthalidone 25 mg  Amlodipine 10 mg Lisinopril 30 mg     Medication list updated and message sent to MD.

## 2021-04-15 NOTE — Addendum Note (Signed)
Addended by: Jacqulynn Cadet on: 04/15/2021 04:58 PM   Modules accepted: Orders

## 2021-12-28 ENCOUNTER — Other Ambulatory Visit: Payer: Self-pay | Admitting: Nurse Practitioner

## 2021-12-28 ENCOUNTER — Other Ambulatory Visit: Payer: Self-pay | Admitting: Internal Medicine

## 2021-12-28 DIAGNOSIS — Z1231 Encounter for screening mammogram for malignant neoplasm of breast: Secondary | ICD-10-CM

## 2022-01-11 ENCOUNTER — Ambulatory Visit
Admission: RE | Admit: 2022-01-11 | Discharge: 2022-01-11 | Disposition: A | Discharge status: Home / Self Care | Payer: Medicaid Other | Source: Ambulatory Visit | Attending: Nurse Practitioner | Admitting: Nurse Practitioner

## 2022-01-11 DIAGNOSIS — Z1231 Encounter for screening mammogram for malignant neoplasm of breast: Secondary | ICD-10-CM

## 2022-06-29 NOTE — Progress Notes (Signed)
Cardiology Office Note:    Date:  06/30/2022   ID:  Dana Ayala, DOB [birth year], MRN [id number]  PCP:  Illa Level, [facility] Cardiologist:  Donato Heinz, MD     Referring MD: Illa Level, *   CC: Here for 1 year follow-up  History of Present Illness:    Dana Ayala is a 66 y.o. female with a hx of the following:   Coronary artery calcification Hep C HLD HTN Obesity Schizophrenia Prediabetes   Patient was referred by Volney Presser, FNP for evaluation of heart murmur and was initially seen in 2021.  Patient previously reported she was not very active, stated most activity she performed was walking around her home.  Her father had a CABG in the 80s had previously been told that she snores.  Denied any smoking history.  Last seen by Dr. Gardiner Rhyme on April 08, 2021 and was overall doing well from a cardiac perspective.  Echocardiogram in 2021 revealed normal biventricular function, no significant valvular abnormalities, grade 1 DD.  In July 2021 a coronary calcium score was revealed to be 6, 70th percentile for her demographic.  At last OV with Dr. Gardiner Rhyme, she did note some lightheadedness after taking her medication.  She noted that she walked twice a week for 3 to 4 hours, overall was doing well from a cardiac perspective.  Sleep study was ordered for a year before but was not done and patient was agreeable to repeat sleep study last year.  Was told to follow-up in 1 year.  Today she presents for 1 year follow-up.  She states she is doing very well since last year.  She tells me she did not have sleep study done as she did not want to have this performed.  Continues to decline this today.  Denies any chest pain, shortness of breath, palpitations, orthopnea, PND, syncope, presyncope, dizziness, increased swelling, bleeding, or claudication.  She continues to walk around 2 times per week. BP stable at home.  Family member  in the room states she just had blood work done last month with primary care provider.  Denies any other questions or concerns.   Past Medical History:  Diagnosis Date   AIN (anal intraepithelial neoplasia) anal canal    Anemia    Coronary artery calcification    Depression    Elevated serum creatinine    Hepatitis C, chronic (HCC)    Hyperlipidemia    Hypertension    Morbid obesity ([place])    Prediabetes    Schizophrenia ([place])     Past Surgical History:  Procedure Laterality Date   COLONOSCOPY     POLYPECTOMY     VAGINAL HYSTERECTOMY      Current Medications: Current Meds  Medication Sig   amLODipine (NORVASC) 10 MG tablet Take 10 mg by mouth daily.   buPROPion (WELLBUTRIN SR) 150 MG 12 hr tablet Take 150 mg by mouth daily.   chlorthalidone (HYGROTON) 25 MG tablet Take 25 mg by mouth daily.   lisinopril (ZESTRIL) 30 MG tablet Take 30 mg by mouth daily.   paliperidone (INVEGA SUSTENNA) 156 MG/ML SUSP injection Inject 156 mg into the muscle every 30 (thirty) days.   traZODone (DESYREL) 100 MG tablet Take 200 mg by mouth at bedtime.     Allergies:   Patient has no known allergies.   Social History   Socioeconomic History   Marital status: Widowed    Spouse name: Not on file  Number of children: 1   Years of education: Not on file   Highest education level: Not on file  Occupational History   Not on file  Tobacco Use   Smoking status: Never   Smokeless tobacco: Never  Substance and Sexual Activity   Alcohol use: No    Alcohol/week: 0.0 standard drinks of alcohol   Drug use: No   Sexual activity: Not on file  Other Topics Concern   Not on file  Social History Narrative   Not on file   Social Determinants of Health   Financial Resource Strain: Not on file  Food Insecurity: Not on file  Transportation Needs: Not on file  Physical Activity: Not on file  Stress: Not on file  Social Connections: Not on file     Family History: The patient's family history  includes Hypertension in her brother, father, and mother. There is no history of Colon polyps, Colon cancer, Esophageal cancer, Rectal cancer, or Stomach cancer.  ROS:   Review of Systems  Constitutional: Negative.   HENT: Negative.    Eyes: Negative.   Respiratory: Negative.    Cardiovascular: Negative.   Gastrointestinal: Negative.   Genitourinary: Negative.   Musculoskeletal: Negative.   Skin: Negative.   Neurological: Negative.   Endo/Heme/Allergies: Negative.   Psychiatric/Behavioral: Negative.       Please see the history of present illness.    All other systems reviewed and are negative.  EKGs/Labs/Other Studies Reviewed:    The following studies were reviewed today:   EKG:  EKG is ordered today.  The ekg ordered today demonstrates NSR, 83 bpm, no acute ischemic changes.   CT cardiac scoring on March 11, 2020: 1.  Coronary calcium score 6.  This was 70th percentile for her demographic. 2.  No acute or significant extracardiac abnormality.  2D complete echo on February 02, 2020:  1. Left ventricular ejection fraction, by estimation, is 65 to 70%. The  left ventricle has hyperdynamic function. The left ventricle has no  regional wall motion abnormalities. There is mild left ventricular  hypertrophy. Left ventricular diastolic  parameters are consistent with Grade I diastolic dysfunction (impaired  relaxation).   2. Right ventricular systolic function is normal. The right ventricular  size is normal. Tricuspid regurgitation signal is inadequate for assessing  PA pressure.   3. The mitral valve is normal in structure. No evidence of mitral valve  regurgitation. No evidence of mitral stenosis.   4. The aortic valve is tricuspid. Aortic valve regurgitation is not  visualized. No aortic stenosis is present.   5. The inferior vena cava is normal in size with greater than 50%  respiratory variability, suggesting right atrial pressure of 3 mmHg.  Recent Labs: No results  found for requested labs within last 365 days.   Recent Lipid Panel    Component Value Date/Time   CHOL 106 04/08/2021 0916   TRIG 66 04/08/2021 0916   HDL 45 04/08/2021 0916   CHOLHDL 2.4 04/08/2021 0916   CHOLHDL 2.4 Ratio 02/05/2009 0000   VLDL 14 02/05/2009 0000   LDLCALC 47 04/08/2021 0916   Physical Exam:    VS:  BP 124/72 (BP Location: Left Arm, Patient Position: Sitting, Cuff Size: Large)   Pulse 83   Ht _0  (1.575 m)   Wt 224 lb 6.4 oz (101.8 kg)   SpO2 99%   BMI 41.04 kg/m     Wt Readings from Last 3 Encounters:  06/30/22 224 lb 6.4 oz (101.8 kg)  04/08/21 215 lb 12.8 oz (97.9 kg)  03/02/20 240 lb 9.6 oz (109.1 kg)     GEN: Obese, 66 y.o. African American female in NAD  HEENT: Left eye exotropia, EOMI, otherwise normal NECK: No JVD; No carotid bruits CARDIAC: S1/S2, RRR, no murmurs, rubs, gallops; 2+ peripheral pulses throughout, strong and equal bilaterally RESPIRATORY:  Clear and diminished to auscultation without rales, wheezing or rhonchi  MUSCULOSKELETAL:  generalized Nonpitting edema, otherwise nothing acute ; No deformity  SKIN: Warm and dry NEUROLOGIC:  Alert and oriented x 3 PSYCHIATRIC:  Normal affect   ASSESSMENT:    1. Coronary artery calcification   2. Hyperlipidemia, unspecified hyperlipidemia type   3. Hypertension, unspecified type   4. Prediabetes   5. Elevated serum creatinine   6. Morbid obesity ([place])    PLAN:    In order of problems listed above:  Coronary artery calcification CT cardiac scoring 2021 revealed coronary calcium score 6, therefore she was in 70th percentile for her demographic. Stable with no anginal symptoms. No indication for ischemic evaluation.  Will increase Crestor to 20 mg daily for recent LDL of 136, will obtain FLP and LFT in 2 months.  Continue lisinopril and Crestor. Heart healthy diet and regular cardiovascular exercise encouraged.   Hyperlipidemia Labs obtained 1 month ago revealed total cholesterol  198, triglycerides 75, HDL 48, and LDL 136.  I discussed with her I want her LDL to be less than 100.  We will increase Crestor to 20 mg daily.  We will obtain FLP and LFT in 2 months. Heart healthy diet and regular cardiovascular exercise encouraged.   Hypertension Blood pressure today is stable at 124/72.  BP is well controlled at home.  Continue amlodipine, chlorthalidone, and lisinopril. Discussed to monitor BP at home at least 2 hours after medications and sitting for 5-10 minutes. Heart healthy diet and regular cardiovascular exercise encouraged.  We will obtain BMET today.  Prediabetes Recent A1c last month 6.2%.  PCP to manage. Heart healthy diet and regular cardiovascular exercise encouraged.   Elevated serum creatinine Labs obtained last month revealed serum creatinine 3, EGFR 44.  This is increased from 1 year ago.  She does have risk factors for developing chronic kidney disease including hypertension and she has prediabetes.  She is also on several medications that could be contributing to this.  We will obtain BMET today.  If kidney function remains the same or continues to decline, plan to adjust medications and/or refer to nephrology.  Morbid Obesity BMI today is 41.04 weight is 224 pounds.  She is up 9 pounds from 1 year ago. Weight loss via diet and exercise encouraged. Discussed the impact being overweight would have on cardiovascular risk. I made a recommendation for prep program referral, she currently defers this at this time.  I recommended she increase her physical activity and gave American Heart Association's recommendation of at least 150 minutes of moderate activity per week.  Heart healthy, low-salt diet recommended.   7.  Disposition: Follow-up in 1 year or sooner if anything changes.   Medication Adjustments/Labs and Tests Ordered: Current medicines are reviewed at length with the patient today.  Concerns regarding medicines are outlined above.  Orders Placed This  Encounter  Procedures   Lipid panel   Hepatic function panel   Basic metabolic panel   EKG 18-HUDJ   Meds ordered this encounter  Medications   rosuvastatin (CRESTOR) 20 MG tablet    Sig: Take 1 tablet (20 mg total) by  mouth daily.    Dispense:  90 tablet    Refill:  3    Patient Instructions  Medication Instructions:  INCREASE CRESTOR 20MG DAILY-YOU MAY TAKE 2 OF THE DOSE YOU HAVE./ NEW RX WAS SENT TO PHARMACY  *If you need a refill on your cardiac medications before your next appointment, please call your pharmacy*  Lab Work: FASTING LIPID AND LFT IN 2 MONTHS AND BMET TODAY If you have labs (blood work) drawn today and your tests are completely normal, you will receive your results only by:  [facility] (if you have MyChart) OR  A paper copy in the mail  If you have any lab test that is abnormal or we need to change your treatment, we will call you to review the results.  Testing/Procedures: NONE  Other Instructions PLEASE READ AND FOLLOW ATTACHED MEDITERRANEAN DIET  PLEASE READ AND FOLLOW ATTACHED SALTY 6  Follow-Up: At [facility], you and your health needs are our priority.  As part of our continuing mission to provide you with exceptional heart care, we have created designated Provider Care Teams.  These Care Teams include your primary Cardiologist (physician) and Advanced Practice Providers (APPs -  Physician Assistants and Nurse Practitioners) who all work together to provide you with the care you need, when you need it.  We recommend signing up for the patient portal called "MyChart".  Sign up information is provided on this After Visit Summary.  MyChart is used to connect with patients for Virtual Visits (Telemedicine).  Patients are able to view lab/test results, encounter notes, upcoming appointments, etc.  Non-urgent messages can be sent to your provider as well.   To learn more about what you can do with MyChart, go to [email or web address].     Your next appointment:   12 month(s)  The format for your next appointment:   In Person  Provider:   Donato Heinz, MD     Important Information About [place] Diet A Mediterranean diet refers to food and lifestyle choices that are based on the traditions of countries located on the [place]. It focuses on eating more fruits, vegetables, whole grains, beans, nuts, seeds, and heart-healthy fats, and eating less dairy, meat, eggs, and processed foods with added sugar, salt, and fat. This way of eating has been shown to help prevent certain conditions and improve outcomes for people who have chronic diseases, like kidney disease and heart disease. What are tips for following this plan? Reading food labels Check the serving size of packaged foods. For foods such as rice and pasta, the serving size refers to the amount of cooked product, not dry. Check the total fat in packaged foods. Avoid foods that have saturated fat or trans fats. Check the ingredient list for added sugars, such as corn syrup. Shopping  Buy a variety of foods that offer a balanced diet, including: Fresh fruits and vegetables (produce). Grains, beans, nuts, and seeds. Some of these may be available in unpackaged forms or large amounts (in bulk). Fresh seafood. Poultry and eggs. Low-fat dairy products. Buy whole ingredients instead of prepackaged foods. Buy fresh fruits and vegetables in-season from local farmers markets. Buy plain frozen fruits and vegetables. If you do not have access to quality fresh seafood, buy precooked frozen shrimp or canned fish, such as tuna, salmon, or sardines. Stock your pantry so you always have certain foods on hand, such as olive oil, canned tuna,  canned tomatoes, rice, pasta, and beans. Cooking Cook foods with extra-virgin olive oil instead of using butter or other vegetable oils. Have meat as a side dish, and have vegetables or grains  as your main dish. This means having meat in small portions or adding small amounts of meat to foods like pasta or stew. Use beans or vegetables instead of meat in common dishes like chili or lasagna. Experiment with different cooking methods. Try roasting, broiling, steaming, and sauting vegetables. Add frozen vegetables to soups, stews, pasta, or rice. Add nuts or seeds for added healthy fats and plant protein at each meal. You can add these to yogurt, salads, or vegetable dishes. Marinate fish or vegetables using olive oil, lemon juice, garlic, and fresh herbs. Meal planning Plan to eat one vegetarian meal one day each week. Try to work up to two vegetarian meals, if possible. Eat seafood two or more times a week. Have healthy snacks readily available, such as: Vegetable sticks with hummus. Greek yogurt. Fruit and nut trail mix. Eat balanced meals throughout the week. This includes: Fruit: 2-3 servings a day. Vegetables: 4-5 servings a day. Low-fat dairy: 2 servings a day. Fish, poultry, or lean meat: 1 serving a day. Beans and legumes: 2 or more servings a week. Nuts and seeds: 1-2 servings a day. Whole grains: 6-8 servings a day. Extra-virgin olive oil: 3-4 servings a day. Limit red meat and sweets to only a few servings a month. Lifestyle  Cook and eat meals together with your family, when possible. Drink enough fluid to keep your urine pale yellow. Be physically active every day. This includes: Aerobic exercise like running or swimming. Leisure activities like gardening, walking, or housework. Get 7-8 hours of sleep each night. If recommended by your health care provider, drink red wine in moderation. This means 1 glass a day for nonpregnant women and 2 glasses a day for men. A glass of wine equals 5 oz (150 mL). What foods should I eat? Fruits Apples. Apricots. Avocado. Berries. Bananas. Cherries. Dates. Figs. Grapes. Lemons. Melon. Oranges. Peaches. Plums.  Pomegranate. Vegetables Artichokes. Beets. Broccoli. Cabbage. Carrots. Eggplant. Green beans. Chard. Kale. Spinach. Onions. Leeks. Peas. Squash. Tomatoes. Peppers. Radishes. Grains Whole-grain pasta. Brown rice. Bulgur wheat. Polenta. Couscous. Whole-wheat bread. [place]. Meats and other proteins Beans. Almonds. Sunflower seeds. Pine nuts. Peanuts. [place]. Salmon. Scallops. Shrimp. [place]. Tilapia. Clams. Oysters. Eggs. Poultry without skin. Dairy Low-fat milk. Cheese. Greek yogurt. Fats and oils Extra-virgin olive oil. Avocado oil. Grapeseed oil. Beverages Water. Red wine. Herbal tea. Sweets and desserts Greek yogurt with honey. Baked apples. Poached pears. Trail mix. Seasonings and condiments Basil. Cilantro. Coriander. Cumin. Mint. Parsley. Sage. Rosemary. Tarragon. Garlic. Oregano. Thyme. Pepper. Balsamic vinegar. Tahini. Hummus. Tomato sauce. Olives. Mushrooms. The items listed above may not be a complete list of foods and beverages you can eat. Contact a dietitian for more information. What foods should I limit? This is a list of foods that should be eaten rarely or only on special occasions. Fruits Fruit canned in syrup. Vegetables Deep-fried potatoes (french fries). Grains Prepackaged pasta or rice dishes. Prepackaged cereal with added sugar. Prepackaged snacks with added sugar. Meats and other proteins Beef. Pork. Lamb. Poultry with skin. Hot dogs. [place] Salines. Dairy Ice cream. Sour cream. Whole milk. Fats and oils Butter. Canola oil. Vegetable oil. Beef fat (tallow). Lard. Beverages Juice. Sugar-sweetened soft drinks. Beer. Liquor and spirits. Sweets and desserts Cookies. Cakes. Pies. Candy. Seasonings and condiments Mayonnaise. Pre-made sauces and marinades. The items listed  above may not be a complete list of foods and beverages you should limit. Contact a dietitian for more information. Summary The Mediterranean diet includes both food and lifestyle choices. Eat a  variety of fresh fruits and vegetables, beans, nuts, seeds, and whole grains. Limit the amount of red meat and sweets that you eat. If recommended by your health care provider, drink red wine in moderation. This means 1 glass a day for nonpregnant women and 2 glasses a day for men. A glass of wine equals 5 oz (150 mL). This information is not intended to replace advice given to you by your health care provider. Make sure you discuss any questions you have with your health care provider. Document Revised: 09/05/2019 Document Reviewed: 07/03/2019 Elsevier Patient Education  Berne, Finis Bud, NP  06/30/2022 10:48 AM    [place]

## 2022-06-30 ENCOUNTER — Encounter: Payer: Self-pay | Admitting: Nurse Practitioner

## 2022-06-30 ENCOUNTER — Ambulatory Visit: Payer: Medicaid Other | Attending: General Practice | Admitting: Nurse Practitioner

## 2022-06-30 VITALS — BP 124/72 | HR 83 | Ht 62.0 in | Wt 224.4 lb

## 2022-06-30 DIAGNOSIS — I2510 Atherosclerotic heart disease of native coronary artery without angina pectoris: Secondary | ICD-10-CM | POA: Insufficient documentation

## 2022-06-30 DIAGNOSIS — I2584 Coronary atherosclerosis due to calcified coronary lesion: Secondary | ICD-10-CM | POA: Diagnosis present

## 2022-06-30 DIAGNOSIS — I10 Essential (primary) hypertension: Secondary | ICD-10-CM | POA: Insufficient documentation

## 2022-06-30 DIAGNOSIS — E785 Hyperlipidemia, unspecified: Secondary | ICD-10-CM | POA: Insufficient documentation

## 2022-06-30 DIAGNOSIS — R7303 Prediabetes: Secondary | ICD-10-CM | POA: Diagnosis present

## 2022-06-30 DIAGNOSIS — R7989 Other specified abnormal findings of blood chemistry: Secondary | ICD-10-CM | POA: Diagnosis present

## 2022-06-30 MED ORDER — ROSUVASTATIN CALCIUM 20 MG PO TABS: 20.0000 mg | ORAL_TABLET | Freq: Every day | ORAL | 3 refills | Status: DC

## 2022-06-30 NOTE — Patient Instructions (Addendum)
Medication Instructions:  INCREASE CRESTOR '20MG'$  DAILY-YOU MAY TAKE 2 OF THE DOSE YOU HAVE./ NEW RX WAS SENT TO PHARMACY  *If you need a refill on your cardiac medications before your next appointment, please call your pharmacy*  Lab Work: FASTING LIPID AND LFT IN 2 MONTHS AND BMET TODAY If you have labs (blood work) drawn today and your tests are completely normal, you will receive your results only by:  MyChart Message (if you have MyChart) OR  A paper copy in the mail  If you have any lab test that is abnormal or we need to change your treatment, we will call you to review the results.  Testing/Procedures: NONE  Other Instructions PLEASE READ AND FOLLOW ATTACHED MEDITERRANEAN DIET  PLEASE READ AND FOLLOW ATTACHED SALTY 6  Follow-Up: At [facility], you and your health needs are our priority.  As part of our continuing mission to provide you with exceptional heart care, we have created designated Provider Care Teams.  These Care Teams include your primary Cardiologist (physician) and Advanced Practice Providers (APPs -  Physician Assistants and Nurse Practitioners) who all work together to provide you with the care you need, when you need it.  We recommend signing up for the patient portal called "MyChart".  Sign up information is provided on this After Visit Summary.  MyChart is used to connect with patients for Virtual Visits (Telemedicine).  Patients are able to view lab/test results, encounter notes, upcoming appointments, etc.  Non-urgent messages can be sent to your provider as well.   To learn more about what you can do with MyChart, go to [email or web address].    Your next appointment:   12 month(s)  The format for your next appointment:   In Person  Provider:   Donato Heinz, MD     Important Information About [place] Diet A Mediterranean diet refers to food and lifestyle choices that are based on the traditions of  countries located on the [place]. It focuses on eating more fruits, vegetables, whole grains, beans, nuts, seeds, and heart-healthy fats, and eating less dairy, meat, eggs, and processed foods with added sugar, salt, and fat. This way of eating has been shown to help prevent certain conditions and improve outcomes for people who have chronic diseases, like kidney disease and heart disease. What are tips for following this plan? Reading food labels Check the serving size of packaged foods. For foods such as rice and pasta, the serving size refers to the amount of cooked product, not dry. Check the total fat in packaged foods. Avoid foods that have saturated fat or trans fats. Check the ingredient list for added sugars, such as corn syrup. Shopping  Buy a variety of foods that offer a balanced diet, including: Fresh fruits and vegetables (produce). Grains, beans, nuts, and seeds. Some of these may be available in unpackaged forms or large amounts (in bulk). Fresh seafood. Poultry and eggs. Low-fat dairy products. Buy whole ingredients instead of prepackaged foods. Buy fresh fruits and vegetables in-season from local farmers markets. Buy plain frozen fruits and vegetables. If you do not have access to quality fresh seafood, buy precooked frozen shrimp or canned fish, such as tuna, salmon, or sardines. Stock your pantry so you always have certain foods on hand, such as olive oil, canned tuna, canned tomatoes, rice, pasta, and beans. Cooking Cook foods with extra-virgin olive oil instead of using butter or other vegetable oils.  Have meat as a side dish, and have vegetables or grains as your main dish. This means having meat in small portions or adding small amounts of meat to foods like pasta or stew. Use beans or vegetables instead of meat in common dishes like chili or lasagna. Experiment with different cooking methods. Try roasting, broiling, steaming, and sauting vegetables. Add  frozen vegetables to soups, stews, pasta, or rice. Add nuts or seeds for added healthy fats and plant protein at each meal. You can add these to yogurt, salads, or vegetable dishes. Marinate fish or vegetables using olive oil, lemon juice, garlic, and fresh herbs. Meal planning Plan to eat one vegetarian meal one day each week. Try to work up to two vegetarian meals, if possible. Eat seafood two or more times a week. Have healthy snacks readily available, such as: Vegetable sticks with hummus. Greek yogurt. Fruit and nut trail mix. Eat balanced meals throughout the week. This includes: Fruit: 2-3 servings a day. Vegetables: 4-5 servings a day. Low-fat dairy: 2 servings a day. Fish, poultry, or lean meat: 1 serving a day. Beans and legumes: 2 or more servings a week. Nuts and seeds: 1-2 servings a day. Whole grains: 6-8 servings a day. Extra-virgin olive oil: 3-4 servings a day. Limit red meat and sweets to only a few servings a month. Lifestyle  Cook and eat meals together with your family, when possible. Drink enough fluid to keep your urine pale yellow. Be physically active every day. This includes: Aerobic exercise like running or swimming. Leisure activities like gardening, walking, or housework. Get 7-8 hours of sleep each night. If recommended by your health care provider, drink red wine in moderation. This means 1 glass a day for nonpregnant women and 2 glasses a day for men. A glass of wine equals 5 oz (150 mL). What foods should I eat? Fruits Apples. Apricots. Avocado. Berries. Bananas. Cherries. Dates. Figs. Grapes. Lemons. Melon. Oranges. Peaches. Plums. Pomegranate. Vegetables Artichokes. Beets. Broccoli. Cabbage. Carrots. Eggplant. Green beans. Chard. Kale. Spinach. Onions. Leeks. Peas. Squash. Tomatoes. Peppers. Radishes. Grains Whole-grain pasta. Brown rice. Bulgur wheat. Polenta. Couscous. Whole-wheat bread. [place]. Meats and other proteins Beans.  Almonds. Sunflower seeds. Pine nuts. Peanuts. [place]. Salmon. Scallops. Shrimp. [place]. Tilapia. Clams. Oysters. Eggs. Poultry without skin. Dairy Low-fat milk. Cheese. Greek yogurt. Fats and oils Extra-virgin olive oil. Avocado oil. Grapeseed oil. Beverages Water. Red wine. Herbal tea. Sweets and desserts Greek yogurt with honey. Baked apples. Poached pears. Trail mix. Seasonings and condiments Basil. Cilantro. Coriander. Cumin. Mint. Parsley. Sage. Rosemary. Tarragon. Garlic. Oregano. Thyme. Pepper. Balsamic vinegar. Tahini. Hummus. Tomato sauce. Olives. Mushrooms. The items listed above may not be a complete list of foods and beverages you can eat. Contact a dietitian for more information. What foods should I limit? This is a list of foods that should be eaten rarely or only on special occasions. Fruits Fruit canned in syrup. Vegetables Deep-fried potatoes (french fries). Grains Prepackaged pasta or rice dishes. Prepackaged cereal with added sugar. Prepackaged snacks with added sugar. Meats and other proteins Beef. Pork. Lamb. Poultry with skin. Hot dogs. [place] Salines. Dairy Ice cream. Sour cream. Whole milk. Fats and oils Butter. Canola oil. Vegetable oil. Beef fat (tallow). Lard. Beverages Juice. Sugar-sweetened soft drinks. Beer. Liquor and spirits. Sweets and desserts Cookies. Cakes. Pies. Candy. Seasonings and condiments Mayonnaise. Pre-made sauces and marinades. The items listed above may not be a complete list of foods and beverages you should limit. Contact a dietitian for more information. Summary  The Mediterranean diet includes both food and lifestyle choices. Eat a variety of fresh fruits and vegetables, beans, nuts, seeds, and whole grains. Limit the amount of red meat and sweets that you eat. If recommended by your health care provider, drink red wine in moderation. This means 1 glass a day for nonpregnant women and 2 glasses a day for men. A glass of wine equals 5 oz (150  mL). This information is not intended to replace advice given to you by your health care provider. Make sure you discuss any questions you have with your health care provider. Document Revised: 09/05/2019 Document Reviewed: 07/03/2019 Elsevier Patient Education  [place].

## 2022-07-01 LAB — BASIC METABOLIC PANEL
BUN/Creatinine Ratio: 22 (ref 12–28)
BUN: 28 mg/dL — ABNORMAL HIGH (ref 8–27)
CO2: 25 mmol/L (ref 20–29)
Calcium: 9.7 mg/dL (ref 8.7–10.3)
Chloride: 98 mmol/L (ref 96–106)
Creatinine, Ser: 1.3 mg/dL — ABNORMAL HIGH (ref 0.57–1.00)
Glucose: 113 mg/dL — ABNORMAL HIGH (ref 70–99)
Potassium: 4 mmol/L (ref 3.5–5.2)
Sodium: 138 mmol/L (ref 134–144)
eGFR: 45 mL/min/{1.73_m2} — ABNORMAL LOW (ref >59)

## 2022-07-03 ENCOUNTER — Other Ambulatory Visit: Payer: Self-pay

## 2022-07-03 DIAGNOSIS — Z79899 Other long term (current) drug therapy: Secondary | ICD-10-CM

## 2022-07-11 LAB — LIPID PANEL
Chol/HDL Ratio: 3.2 ratio (ref 0.0–4.4)
Cholesterol, Total: 173 mg/dL (ref 100–199)
HDL: 54 mg/dL (ref >39)
LDL Chol Calc (NIH): 106 mg/dL — ABNORMAL HIGH (ref 0–99)
Triglycerides: 68 mg/dL (ref 0–149)
VLDL Cholesterol Cal: 13 mg/dL (ref 5–40)

## 2022-07-11 LAB — HEPATIC FUNCTION PANEL
ALT: 17 IU/L (ref 0–32)
AST: 20 IU/L (ref 0–40)
Albumin: 4.1 g/dL (ref 3.9–4.9)
Alkaline Phosphatase: 86 IU/L (ref 44–121)
Bilirubin Total: <0.2 mg/dL (ref 0.0–1.2)
Bilirubin, Direct: <0.1 mg/dL (ref 0.00–0.40)
Total Protein: 7.7 g/dL (ref 6.0–8.5)

## 2022-07-14 MED ORDER — ROSUVASTATIN CALCIUM 40 MG PO TABS: 40.0000 mg | ORAL_TABLET | Freq: Every day | ORAL | 1 refills | Status: DC

## 2022-07-14 NOTE — Addendum Note (Signed)
Addended by: Sung Amabile on: 07/14/2022 10:32 AM   Modules accepted: Orders

## 2022-11-07 ENCOUNTER — Telehealth: Payer: Self-pay | Admitting: Cardiology

## 2022-11-07 DIAGNOSIS — E785 Hyperlipidemia, unspecified: Secondary | ICD-10-CM

## 2022-11-07 NOTE — Telephone Encounter (Signed)
*  STAT* If patient is at the pharmacy, call can be transferred to refill team.   1. Which medications need to be refilled? (please list name of each medication and dose if known) rosuvastatin (CRESTOR) 40 MG tablet   2. Which pharmacy/location (including street and city if local pharmacy) is medication to be sent to?  [facility] - [place], [place] - [address]   3. Do they need a 30 day or 90 day supply? [place]

## 2022-11-08 MED ORDER — ROSUVASTATIN CALCIUM 40 MG PO TABS: 40.0000 mg | ORAL_TABLET | Freq: Every day | ORAL | 3 refills | Status: AC

## 2022-11-08 NOTE — Telephone Encounter (Signed)
Refill for Rosuvastatin has been sent to [facility].

## 2022-11-21 IMAGING — MG MM DIGITAL SCREENING BILAT W/ TOMO AND CAD
6 of 10 series · 6 of 30 positions shown · non-contrast
Comparison: Previous exam(s).

CLINICAL DATA: Screening.

EXAM:
DIGITAL SCREENING BILATERAL MAMMOGRAM WITH TOMOSYNTHESIS AND CAD
TECHNIQUE: Bilateral screening digital craniocaudal and mediolateral oblique
mammograms were obtained. Bilateral screening digital breast
tomosynthesis was performed. The images were evaluated with
computer-aided detection.

[L MLO synth-2D (1 of 2)]
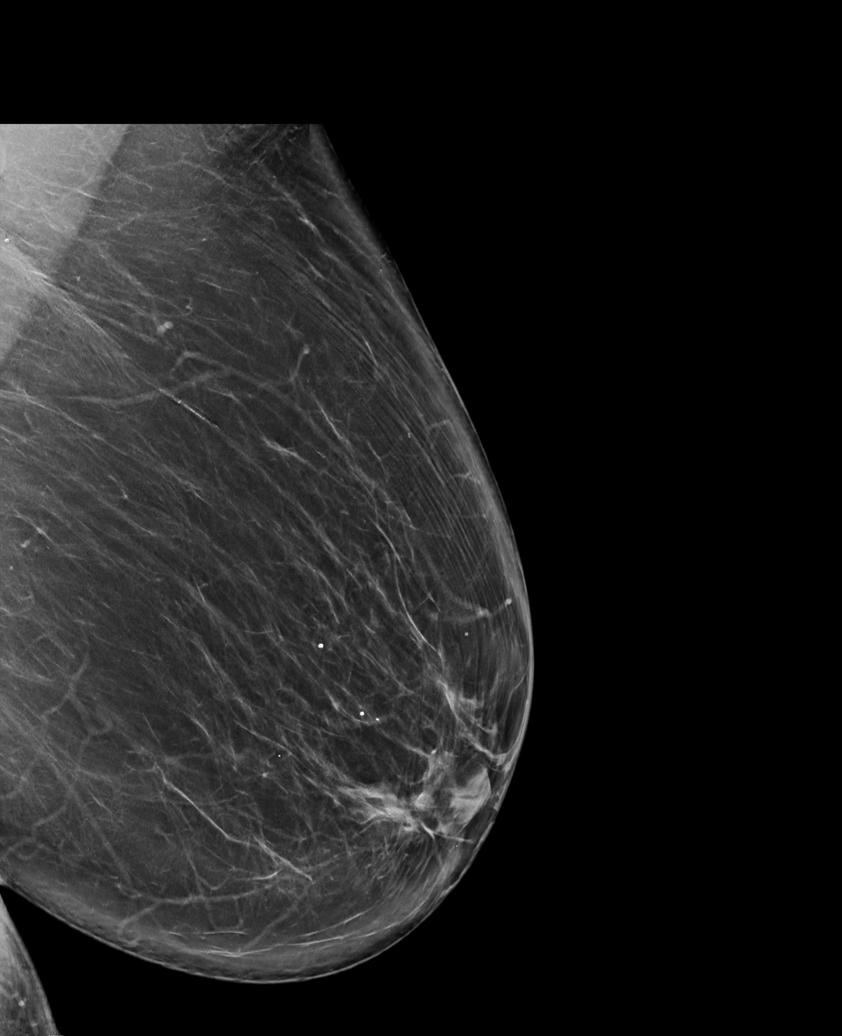

[R MLO synth-2D]
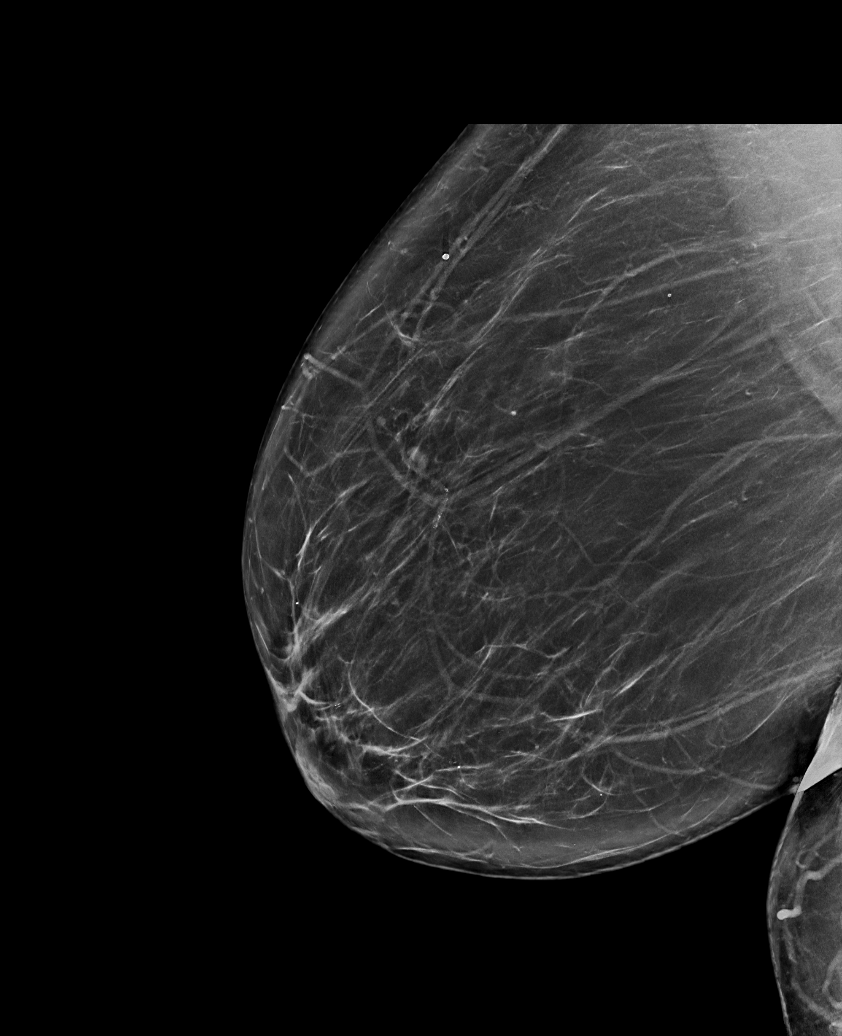

[L CC synth-2D]
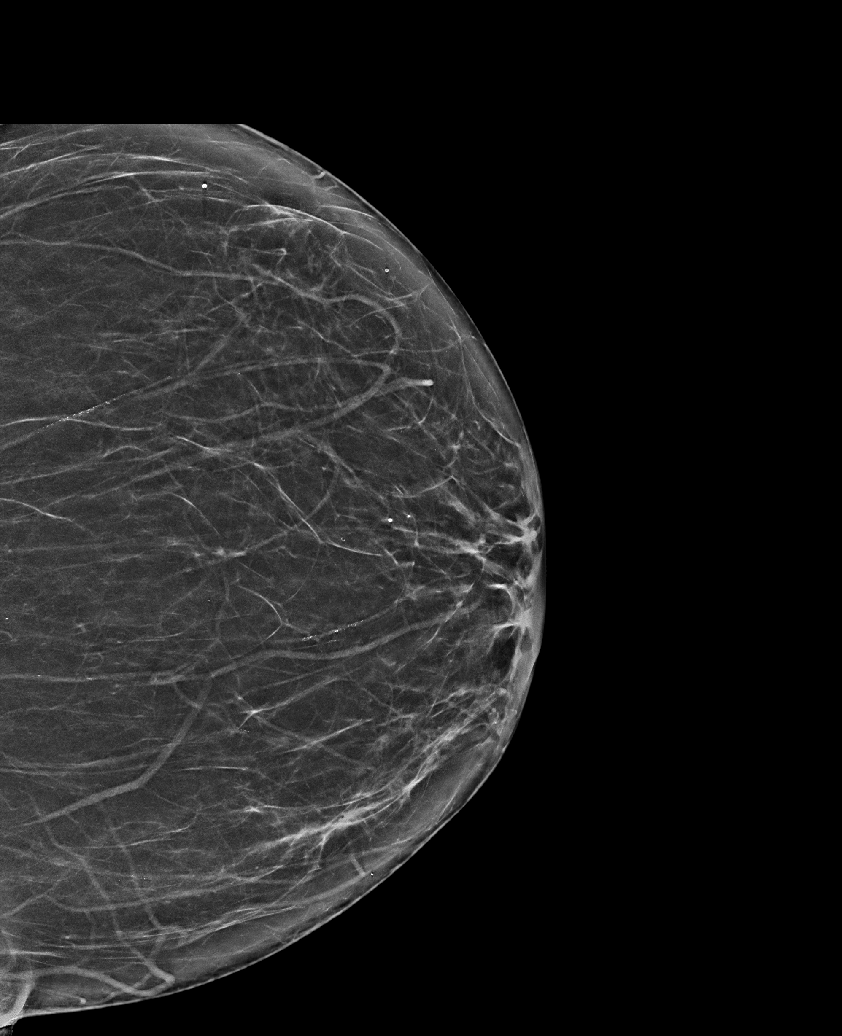

[R CC synth-2D]
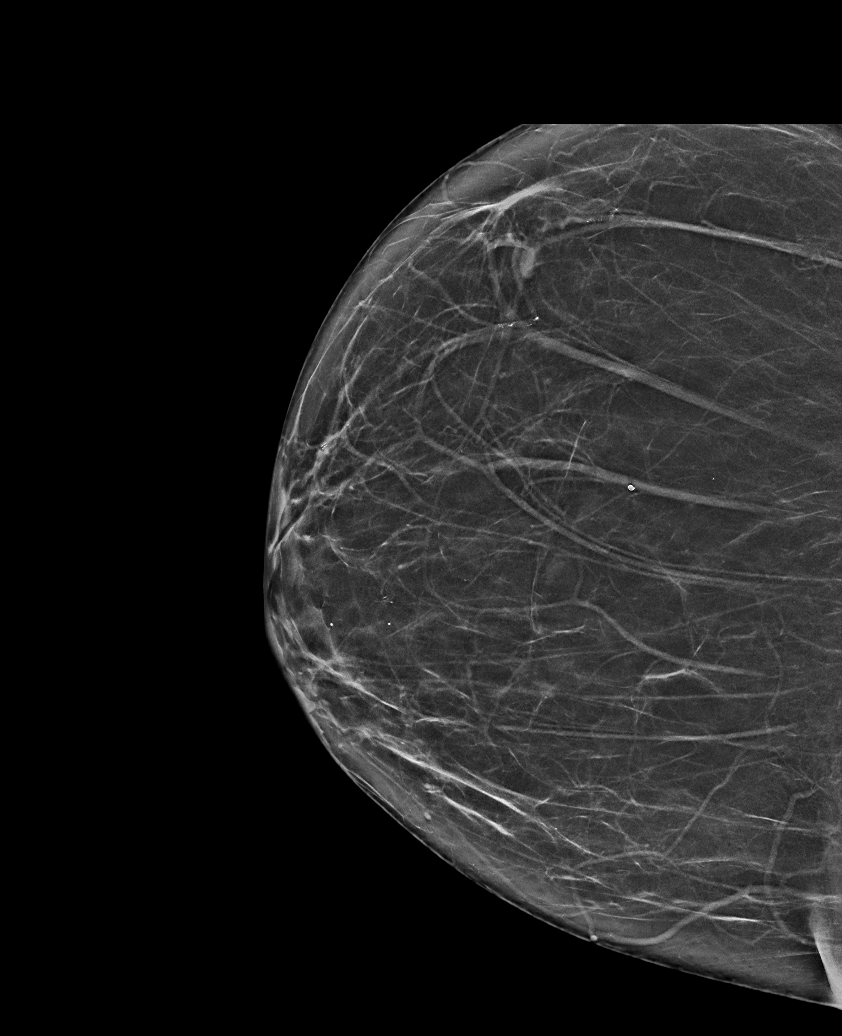

[L MLO synth-2D (2 of 2)]
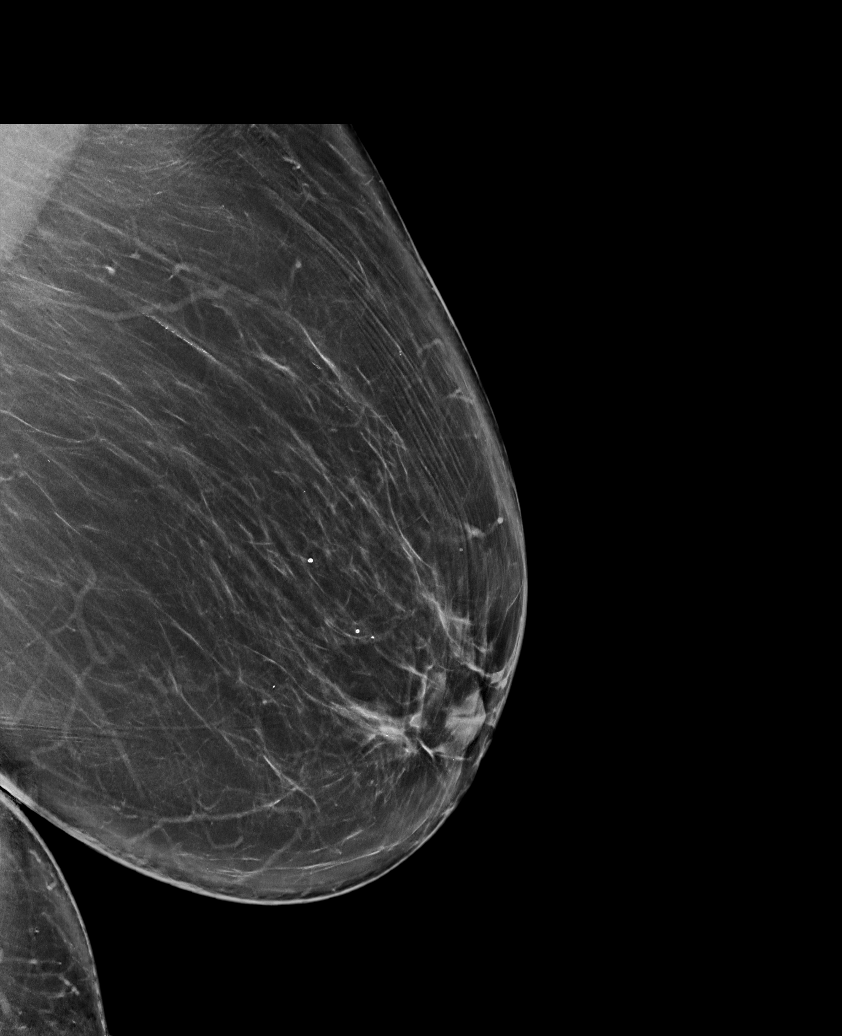

[R CC tomo · tomo slice 37/72.0]
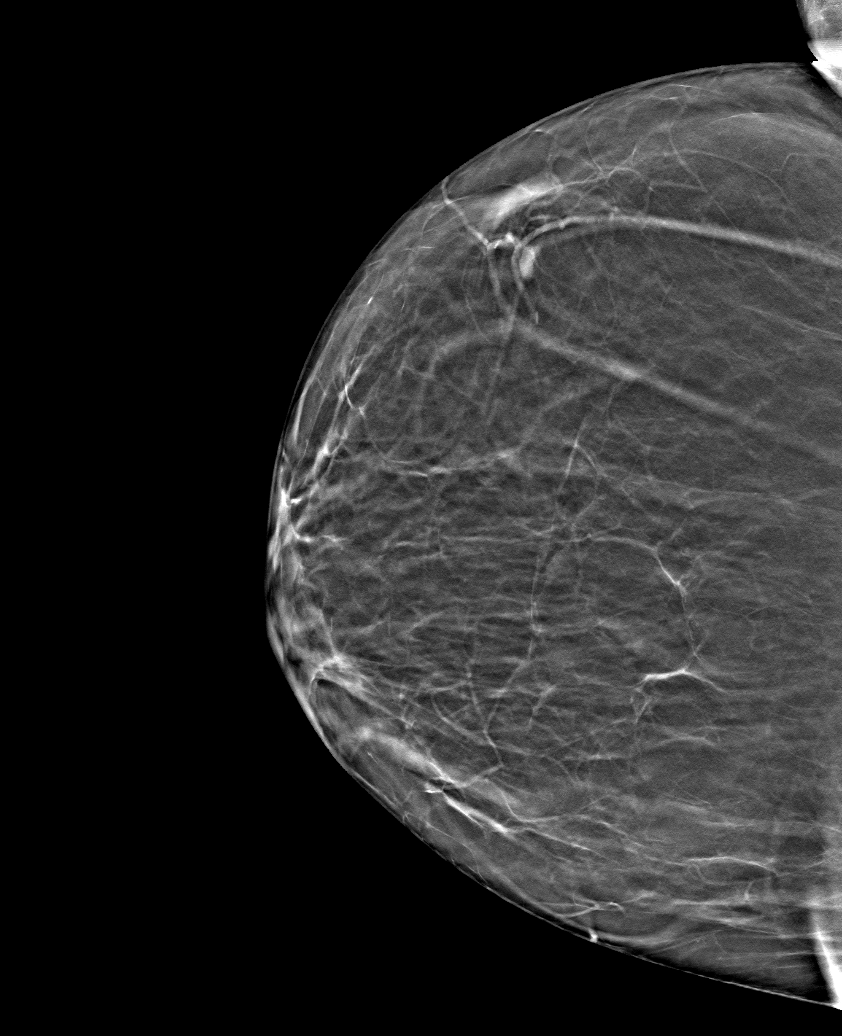

[6 of 30 positions shown; findings below may reference images not displayed]

ACR Breast Density Category b: There are scattered areas of
fibroglandular density.
FINDINGS: There are no findings suspicious for malignancy.
IMPRESSION: No mammographic evidence of malignancy. A result letter of this
screening mammogram will be mailed directly to the patient.

RECOMMENDATION:
Screening mammogram in one year. (Code:51-O-LD2)

BI-RADS CATEGORY  1: Negative.

## 2022-12-12 ENCOUNTER — Other Ambulatory Visit: Payer: Self-pay | Admitting: Nurse Practitioner

## 2022-12-12 DIAGNOSIS — Z0000 Encounter for general adult medical examination without abnormal findings: Secondary | ICD-10-CM

## 2023-01-16 ENCOUNTER — Ambulatory Visit
Admission: RE | Admit: 2023-01-16 | Discharge: 2023-01-16 | Disposition: A | Discharge status: Home / Self Care | Payer: Medicaid Other | Source: Ambulatory Visit | Attending: Nurse Practitioner | Admitting: Nurse Practitioner

## 2023-01-16 DIAGNOSIS — Z0000 Encounter for general adult medical examination without abnormal findings: Secondary | ICD-10-CM

## 2023-12-07 ENCOUNTER — Other Ambulatory Visit: Payer: Self-pay | Admitting: Nurse Practitioner

## 2023-12-07 DIAGNOSIS — Z1231 Encounter for screening mammogram for malignant neoplasm of breast: Secondary | ICD-10-CM

## 2023-12-16 NOTE — Progress Notes (Unsigned)
 Cardiology Office Note:    Date:  12/17/2023   ID:  Dana Ayala, DOB [birth year], MRN [id number]  PCP:  [facility]  Cardiologist:  Wendie Hamburg, MD  Electrophysiologist:  None   Referring MD: Hermine Loots, *   Chief Complaint  Patient presents with   Palpitations     History of Present Illness:    Dana Ayala is a 68 y.o. female with a hx of hypertension, morbid obesity, hyperlipidemia, schizophrenia who presents for follow-up.  She was referred by Jolyn Needles, FNP for evaluation of heart murmur, initially seen on 01/30/2020.    Echocardiogram on 02/02/2020 showed normal biventricular function, grade 1 diastolic dysfunction, no significant valvular disease.  Calcium  score 6 on 03/11/2020 (70th percentile)  Since last clinic visit, she reports she is having palpitations about once per week, last for 1 minute and feels like heart is racing.  She denies any chest pain, dyspnea, lower extremity edema.  She reports some lightheadedness with denies any syncope.  Does report having some swelling in her legs.  She has not been exercising.  Past Medical History:  Diagnosis Date   AIN (anal intraepithelial neoplasia) anal canal    Anemia    Coronary artery calcification    Depression    Elevated serum creatinine    Hepatitis C, chronic (HCC)    Hyperlipidemia    Hypertension    Morbid obesity (HCC)    Prediabetes    Schizophrenia (HCC)     Past Surgical History:  Procedure Laterality Date   COLONOSCOPY     POLYPECTOMY     VAGINAL HYSTERECTOMY      Current Medications: Current Meds  Medication Sig   amLODipine (NORVASC) 10 MG tablet Take 10 mg by mouth daily.   buPROPion (WELLBUTRIN SR) 150 MG 12 hr tablet Take 150 mg by mouth daily.   chlorthalidone (HYGROTON) 25 MG tablet Take 25 mg by mouth daily.   lisinopril (ZESTRIL) 30 MG tablet Take 30 mg by mouth daily.   paliperidone (INVEGA SUSTENNA) 156 MG/ML SUSP injection Inject  156 mg into the muscle every 30 (thirty) days.   rosuvastatin  (CRESTOR ) 40 MG tablet Take 1 tablet (40 mg total) by mouth daily.   traZODone (DESYREL) 100 MG tablet Take 200 mg by mouth at bedtime.     Allergies:   Patient has no known allergies.   Social History   Socioeconomic History   Marital status: Widowed    Spouse name: Not on file   Number of children: 1   Years of education: Not on file   Highest education level: Not on file  Occupational History   Not on file  Tobacco Use   Smoking status: Never   Smokeless tobacco: Never  Substance and Sexual Activity   Alcohol use: No    Alcohol/week: 0.0 standard drinks of alcohol   Drug use: No   Sexual activity: Not on file  Other Topics Concern   Not on file  Social History Narrative   Not on file   Social Drivers of Health   Financial Resource Strain: Not on file  Food Insecurity: Not on file  Transportation Needs: Not on file  Physical Activity: Not on file  Stress: Not on file  Social Connections: Not on file     Family History: The patient's family history includes Hypertension in her brother, father, and mother. There is no history of Colon polyps, Colon cancer, Esophageal cancer, Rectal cancer, Stomach cancer, or Breast  cancer.  ROS:   Please see the history of present illness.    All other systems reviewed and are negative.  EKGs/Labs/Other Studies Reviewed:    The following studies were reviewed today:   EKG:   12/27/23: NSR with PACs, rate 85, poor R wave progression  Recent Labs: No results found for requested labs within last 365 days.  Recent Lipid Panel    Component Value Date/Time   CHOL 173 07/10/2022 0811   TRIG 68 07/10/2022 0811   HDL 54 07/10/2022 0811   CHOLHDL 3.2 07/10/2022 0811   CHOLHDL 2.4 Ratio 02/05/2009 0000   VLDL 14 02/05/2009 0000   LDLCALC 106 (H) 07/10/2022 0811    Physical Exam:    VS:  BP (!) 142/78 (BP Location: Right Arm, Patient Position: Sitting, Cuff Size:  Normal)   Pulse 72   Ht 5' 0.2" (1.529 m)   Wt 229 lb (103.9 kg)   BMI 44.43 kg/m     Wt Readings from Last 3 Encounters:  12/17/23 229 lb (103.9 kg)  06/30/22 224 lb 6.4 oz (101.8 kg)  04/08/21 215 lb 12.8 oz (97.9 kg)     GEN:  in no acute distress HEENT: Normal NECK: No JVD CARDIAC: RRR, no murmur heard RESPIRATORY:  Clear to auscultation without rales, wheezing or rhonchi  ABDOMEN: Soft, non-tender, non-distended MUSCULOSKELETAL:  No edema; No deformity  SKIN: Warm and dry NEUROLOGIC:  Alert and oriented x 3 PSYCHIATRIC:  Normal affect   ASSESSMENT:    1. Palpitations   2. Coronary artery calcification   3. Daytime somnolence   4. Hyperlipidemia, unspecified hyperlipidemia type   5. Hypertension, unspecified type   6. Morbid obesity (HCC)      PLAN:    Palpitations: Description concerning for arrhythmia, will evaluate with Zio patch x 14 days  Heart murmur: Previously with 2/6 systolic murmur on exam, there are no murmurs on exam today.  Echocardiogram on 02/02/2020 showed normal biventricular function, grade 1 diastolic dysfunction, no significant valvular disease.  Hypertension: Previously was on lisinopril 30 mg daily, chlorthalidone 25 mg daily, and amlodipine 10 mg daily.  She is not sure what she is taking now.  BP elevated in clinic - Will schedule appointment in pharmacy hypertension clinic, asked her to bring all her medications with her to confirm what she is taking.  Asked her to check her BP daily for next 2 weeks and bring log to appointment  Hyperlipidemia: LDL 123 on 11/27/2019, Calcium  score 6 on 03/11/2020 (70th percentile).  Started rosuvastatin , currently on 20 mg daily.  We will recheck lipid panel  Morbid obesity: Body mass index is 44.43 kg/m.  Discussed referral to healthy weight and wellness but she declines at this time.  Diet/exercise recommended  Daytime somnolence: Sleep study ordered previously but was not done.  Agreeable to having done  now.  STOP-BANG 4  RTC in 6 months   Medication Adjustments/Labs and Tests Ordered: Current medicines are reviewed at length with the patient today.  Concerns regarding medicines are outlined above.  Orders Placed This Encounter  Procedures   Comprehensive Metabolic Panel (CMET)   Lipid panel   Magnesium   CBC w/Diff/Platelet   AMB Referral to [facility]   LONG TERM MONITOR (3-14 DAYS)   EKG 12-Lead   Home sleep test    No orders of the defined types were placed in this encounter.    Patient Instructions  Medication Instructions:  Continue current medications *If you need a refill  on your cardiac medications before your next appointment, please call your pharmacy*  Lab Work: Cbc, mg, lipid panel, mg today If you have labs (blood work) drawn today and your tests are completely normal, you will receive your results only by: MyChart Message (if you have MyChart) OR A paper copy in the mail If you have any lab test that is abnormal or we need to change your treatment, we will call you to review the results.  Testing/Procedures: Home sleep study  Your physician has recommended that you have a sleep study. This test records several body functions during sleep, including: brain activity, eye movement, oxygen and carbon dioxide blood levels, heart rate and rhythm, breathing rate and rhythm, the flow of air through your mouth and nose, snoring, body muscle movements, and chest and belly movement.  Follow-Up: At [facility], you and your health needs are our priority.  As part of our continuing mission to provide you with exceptional heart care, our providers are all part of one team.  This team includes your primary Cardiologist (physician) and Advanced Practice Providers or APPs (Physician Assistants and Nurse Practitioners) who all work together to provide you with the care you need, when you need it.  Your next appointment:   6 month(s)  Provider:   Wendie Hamburg, MD    We recommend signing up for the patient portal called "MyChart".  Sign up information is provided on this After Visit Summary.  MyChart is used to connect with patients for Virtual Visits (Telemedicine).  Patients are able to view lab/test results, encounter notes, upcoming appointments, etc.  Non-urgent messages can be sent to your provider as well.   To learn more about what you can do with MyChart, go to [email or web address].   Other Instructions ZIO XT- Long Term Monitor Instructions  Your physician has requested you wear a ZIO patch monitor for 14 days.  This is a single patch monitor. [employer] supplies one patch monitor per enrollment. Additional stickers are not available. Please do not apply patch if you will be having a Nuclear Stress Test,  Echocardiogram, Cardiac CT, MRI, or Chest Xray during the period you would be wearing the  monitor. The patch cannot be worn during these tests. You cannot remove and re-apply the  ZIO XT patch monitor.  Your ZIO patch monitor will be mailed 3 day USPS to your address on file. It may take 3-5 days  to receive your monitor after you have been enrolled.  Once you have received your monitor, please review the enclosed instructions. Your monitor  has already been registered assigning a specific monitor serial # to you.  Billing and Patient Assistance Program Information  We have supplied [employer] with any of your insurance information on file for billing purposes. [employer] offers a sliding scale Patient Assistance Program for patients that do not have  insurance, or whose insurance does not completely cover the cost of the ZIO monitor.  You must apply for the Patient Assistance Program to qualify for this discounted rate.  To apply, please call [employer] at [phone], select option 4, select option 2, ask to apply for  Patient Assistance Program. Sanna Crystal will ask your household income, and how many people  are in your household.  They will quote your out-of-pocket cost based on that information.  [employer] will also be able to set up a 56-month, interest-free payment plan if needed.  Applying the monitor   Shave hair from upper left chest.  Hold  abrader disc by orange tab. Rub abrader in 40 strokes over the upper left chest as  indicated in your monitor instructions.  Clean area with 4 enclosed alcohol pads. Let dry.  Apply patch as indicated in monitor instructions. Patch will be placed under collarbone on left  side of chest with arrow pointing upward.  Rub patch adhesive wings for 2 minutes. Remove white label marked "1". Remove the white  label marked "2". Rub patch adhesive wings for 2 additional minutes.  While looking in a mirror, press and release button in center of patch. A small green light will  flash 3-4 times. This will be your only indicator that the monitor has been turned on.  Do not shower for the first 24 hours. You may shower after the first 24 hours.  Press the button if you feel a symptom. You will hear a small click. Record Date, Time and  Symptom in the Patient Logbook.  When you are ready to remove the patch, follow instructions on the last 2 pages of Patient  Logbook. Stick patch monitor onto the last page of Patient Logbook.  Place Patient Logbook in the blue and white box. Use locking tab on box and tape box closed  securely. The blue and white box has prepaid postage on it. Please place it in the mailbox as  soon as possible. Your physician should have your test results approximately 7 days after the  monitor has been mailed back to [facility].  Call [facility] Customer Care at [phone] if you have questions regarding  your ZIO XT patch monitor. Call them immediately if you see an orange light blinking on your  monitor.  If your monitor falls off in less than 4 days, contact our Monitor department at [phone].  If your monitor becomes loose or falls off after 4 days call  [employer] at [phone] for  suggestions on securing your monitor   Please check blood pressure daily for next 2 weeks and send those results or call office       Signed, Wendie Hamburg, MD  12/17/2023 9:30 AM    [facility]

## 2023-12-17 ENCOUNTER — Ambulatory Visit

## 2023-12-17 ENCOUNTER — Ambulatory Visit: Payer: Medicaid Other | Attending: Cardiology | Admitting: Cardiology

## 2023-12-17 VITALS — BP 142/78 | HR 72 | Ht 60.2 in | Wt 229.0 lb

## 2023-12-17 DIAGNOSIS — R002 Palpitations: Secondary | ICD-10-CM | POA: Insufficient documentation

## 2023-12-17 DIAGNOSIS — E785 Hyperlipidemia, unspecified: Secondary | ICD-10-CM | POA: Diagnosis present

## 2023-12-17 DIAGNOSIS — R400 Somnolence: Secondary | ICD-10-CM | POA: Insufficient documentation

## 2023-12-17 DIAGNOSIS — I2510 Atherosclerotic heart disease of native coronary artery without angina pectoris: Secondary | ICD-10-CM | POA: Diagnosis present

## 2023-12-17 DIAGNOSIS — I10 Essential (primary) hypertension: Secondary | ICD-10-CM | POA: Insufficient documentation

## 2023-12-17 NOTE — Patient Instructions (Addendum)
 Medication Instructions:  Continue current medications *If you need a refill on your cardiac medications before your next appointment, please call your pharmacy*  Lab Work: Cbc, mg, lipid panel, mg today If you have labs (blood work) drawn today and your tests are completely normal, you will receive your results only by: MyChart Message (if you have MyChart) OR A paper copy in the mail If you have any lab test that is abnormal or we need to change your treatment, we will call you to review the results.  Testing/Procedures: Home sleep study  Your physician has recommended that you have a sleep study. This test records several body functions during sleep, including: brain activity, eye movement, oxygen and carbon dioxide blood levels, heart rate and rhythm, breathing rate and rhythm, the flow of air through your mouth and nose, snoring, body muscle movements, and chest and belly movement.  Follow-Up: At [facility], you and your health needs are our priority.  As part of our continuing mission to provide you with exceptional heart care, our providers are all part of one team.  This team includes your primary Cardiologist (physician) and Advanced Practice Providers or APPs (Physician Assistants and Nurse Practitioners) who all work together to provide you with the care you need, when you need it.  Your next appointment:   6 month(s)  Provider:   Wendie Hamburg, MD    We recommend signing up for the patient portal called "MyChart".  Sign up information is provided on this After Visit Summary.  MyChart is used to connect with patients for Virtual Visits (Telemedicine).  Patients are able to view lab/test results, encounter notes, upcoming appointments, etc.  Non-urgent messages can be sent to your provider as well.   To learn more about what you can do with MyChart, go to [email or web address].   Other Instructions ZIO XT- Long Term Monitor Instructions  Your physician has  requested you wear a ZIO patch monitor for 14 days.  This is a single patch monitor. [employer] supplies one patch monitor per enrollment. Additional stickers are not available. Please do not apply patch if you will be having a Nuclear Stress Test,  Echocardiogram, Cardiac CT, MRI, or Chest Xray during the period you would be wearing the  monitor. The patch cannot be worn during these tests. You cannot remove and re-apply the  ZIO XT patch monitor.  Your ZIO patch monitor will be mailed 3 day USPS to your address on file. It may take 3-5 days  to receive your monitor after you have been enrolled.  Once you have received your monitor, please review the enclosed instructions. Your monitor  has already been registered assigning a specific monitor serial # to you.  Billing and Patient Assistance Program Information  We have supplied [employer] with any of your insurance information on file for billing purposes. [employer] offers a sliding scale Patient Assistance Program for patients that do not have  insurance, or whose insurance does not completely cover the cost of the ZIO monitor.  You must apply for the Patient Assistance Program to qualify for this discounted rate.  To apply, please call [employer] at [phone], select option 4, select option 2, ask to apply for  Patient Assistance Program. Sanna Crystal will ask your household income, and how many people  are in your household. They will quote your out-of-pocket cost based on that information.  [employer] will also be able to set up a 64-month, interest-free payment plan if needed.  Applying the monitor  Shave hair from upper left chest.  Hold abrader disc by orange tab. Rub abrader in 40 strokes over the upper left chest as  indicated in your monitor instructions.  Clean area with 4 enclosed alcohol pads. Let dry.  Apply patch as indicated in monitor instructions. Patch will be placed under collarbone on left  side of chest with arrow pointing  upward.  Rub patch adhesive wings for 2 minutes. Remove white label marked "1". Remove the white  label marked "2". Rub patch adhesive wings for 2 additional minutes.  While looking in a mirror, press and release button in center of patch. A small green light will  flash 3-4 times. This will be your only indicator that the monitor has been turned on.  Do not shower for the first 24 hours. You may shower after the first 24 hours.  Press the button if you feel a symptom. You will hear a small click. Record Date, Time and  Symptom in the Patient Logbook.  When you are ready to remove the patch, follow instructions on the last 2 pages of Patient  Logbook. Stick patch monitor onto the last page of Patient Logbook.  Place Patient Logbook in the blue and white box. Use locking tab on box and tape box closed  securely. The blue and white box has prepaid postage on it. Please place it in the mailbox as  soon as possible. Your physician should have your test results approximately 7 days after the  monitor has been mailed back to [facility].  Call [facility] Customer Care at [phone] if you have questions regarding  your ZIO XT patch monitor. Call them immediately if you see an orange light blinking on your  monitor.  If your monitor falls off in less than 4 days, contact our Monitor department at [phone].  If your monitor becomes loose or falls off after 4 days call [employer] at [phone] for  suggestions on securing your monitor   Please check blood pressure daily for next 2 weeks and send those results or call office

## 2023-12-17 NOTE — Progress Notes (Unsigned)
 Enrolled for [employer] to mail a ZIO XT long term holter monitor to the patients address on file.

## 2023-12-18 LAB — COMPREHENSIVE METABOLIC PANEL WITH GFR
ALT: 15 IU/L (ref 0–32)
AST: 18 IU/L (ref 0–40)
Albumin: 4.1 g/dL (ref 3.9–4.9)
Alkaline Phosphatase: 98 IU/L (ref 44–121)
BUN/Creatinine Ratio: 6 — ABNORMAL LOW (ref 12–28)
BUN: 5 mg/dL — ABNORMAL LOW (ref 8–27)
Bilirubin Total: <0.2 mg/dL (ref 0.0–1.2)
CO2: 23 mmol/L (ref 20–29)
Calcium: 9.7 mg/dL (ref 8.7–10.3)
Chloride: 106 mmol/L (ref 96–106)
Creatinine, Ser: 0.86 mg/dL (ref 0.57–1.00)
Globulin, Total: 3.1 g/dL (ref 1.5–4.5)
Glucose: 93 mg/dL (ref 70–99)
Potassium: 4.2 mmol/L (ref 3.5–5.2)
Sodium: 144 mmol/L (ref 134–144)
Total Protein: 7.2 g/dL (ref 6.0–8.5)
eGFR: 74 mL/min/{1.73_m2} (ref >59)

## 2023-12-18 LAB — CBC WITH DIFFERENTIAL/PLATELET
Basophils Absolute: 0 10*3/uL (ref 0.0–0.2)
Basos: 0 %
EOS (ABSOLUTE): 0.1 10*3/uL (ref 0.0–0.4)
Eos: 1 %
Hematocrit: 31.9 % — ABNORMAL LOW (ref 34.0–46.6)
Hemoglobin: 10.1 g/dL — ABNORMAL LOW (ref 11.1–15.9)
Immature Grans (Abs): 0 10*3/uL (ref 0.0–0.1)
Immature Granulocytes: 0 %
Lymphocytes Absolute: 2.7 10*3/uL (ref 0.7–3.1)
Lymphs: 33 %
MCH: 24 pg — ABNORMAL LOW (ref 26.6–33.0)
MCHC: 31.7 g/dL (ref 31.5–35.7)
MCV: 76 fL — ABNORMAL LOW (ref 79–97)
Monocytes Absolute: 0.5 10*3/uL (ref 0.1–0.9)
Monocytes: 7 %
Neutrophils Absolute: 4.8 10*3/uL (ref 1.4–7.0)
Neutrophils: 59 %
Platelets: 230 10*3/uL (ref 150–450)
RBC: 4.2 x10E6/uL (ref 3.77–5.28)
RDW: 15.2 % (ref 11.7–15.4)
WBC: 8.1 10*3/uL (ref 3.4–10.8)

## 2023-12-18 LAB — LIPID PANEL
Chol/HDL Ratio: 3.5 ratio (ref 0.0–4.4)
Cholesterol, Total: 181 mg/dL (ref 100–199)
HDL: 52 mg/dL (ref >39)
LDL Chol Calc (NIH): 117 mg/dL — ABNORMAL HIGH (ref 0–99)
Triglycerides: 66 mg/dL (ref 0–149)
VLDL Cholesterol Cal: 12 mg/dL (ref 5–40)

## 2023-12-18 LAB — MAGNESIUM: Magnesium: 1.9 mg/dL (ref 1.6–2.3)

## 2023-12-20 ENCOUNTER — Other Ambulatory Visit: Payer: Self-pay | Admitting: *Deleted

## 2023-12-20 DIAGNOSIS — D509 Iron deficiency anemia, unspecified: Secondary | ICD-10-CM

## 2023-12-20 NOTE — Progress Notes (Signed)
 Called and results  and recommendations per Dr. Alda Amas given to [facility] [place]. Rosevelt Constable, [place] request call back after he  gets off of work to verify if patient is taking Rosuvastatin . Mae Schlossman, DPR aware that per Dr. Alda Amas would like for patient to have blood work done.Understanding verbalized.

## 2024-01-17 ENCOUNTER — Ambulatory Visit

## 2024-01-22 ENCOUNTER — Ambulatory Visit
Admission: RE | Admit: 2024-01-22 | Discharge: 2024-01-22 | Disposition: A | Discharge status: Home / Self Care | Source: Ambulatory Visit | Attending: Nurse Practitioner | Admitting: Nurse Practitioner

## 2024-01-22 DIAGNOSIS — Z1231 Encounter for screening mammogram for malignant neoplasm of breast: Secondary | ICD-10-CM

## 2024-02-08 ENCOUNTER — Encounter: Payer: Self-pay | Admitting: Pharmacist Clinician (PhC)/ Clinical Pharmacy Specialist

## 2024-02-08 ENCOUNTER — Other Ambulatory Visit (HOSPITAL_COMMUNITY): Payer: Self-pay

## 2024-02-08 ENCOUNTER — Ambulatory Visit: Attending: Cardiology | Admitting: Pharmacist Clinician (PhC)/ Clinical Pharmacy Specialist

## 2024-02-08 VITALS — BP 164/85 | HR 70

## 2024-02-08 DIAGNOSIS — I10 Essential (primary) hypertension: Secondary | ICD-10-CM | POA: Insufficient documentation

## 2024-02-08 MED ORDER — AMLODIPINE BESYLATE 5 MG PO TABS: 5.0000 mg | ORAL_TABLET | Freq: Every day | ORAL | 3 refills | Status: AC

## 2024-02-08 NOTE — Progress Notes (Signed)
 Office Visit    Patient Name: Dana Ayala Date of Encounter: 02/08/2024  Primary Care Provider:  [facility] Primary Cardiologist:  Lonni LITTIE Nanas, MD  Chief Complaint    Hypertension  Significant Past Medical History   HLD 6/25 LDL 117, rosuvastatin  20 mg daily (unsure if using)  CAD 4/21 CAC = 6 (70th percentile)  Obesity BMI 44.43, declined referral to [facility]  palpitations Recently prescribed 14d monitor  Daytime somnolence STOP-BANG = 4, sleep study ordered    No Known Allergies  History of Present Illness    Dana Ayala is a 68 y.o. female patient of Dr Nanas, in the office today for hypertension evaluation.   She was most recently seen by Dr. Nanas in May, with a BP of 142/78.  Patient was unsure what medications she takes, so she was asked to monitor pressure at home and bring all medications to PharmD appointment.  Based on fill dates in Epic, no obvious non-compliance issues.  Today she is here with her brother.  Brought her medications, and currently just taking 3 meds daily.  Does not have any amlodipine and is not familiar with medication names or when it might have last been taken.  Based on refill data, probably ran out sometime in May.    Blood Pressure Goal:  130/80  Current Medications:  lisinopril 30 mg every day, chlorthalidone 25 mg every day, amlodipine 10 mg every day   Family Hx:   chart notes both parents and brother had hypertension  Social Hx:      Tobacco:no  Alcohol: no  Caffeine:no  Diet:    home cooked meals, mostly baked chicken and pintos (canned) vegetables - greens, beans (also canned), snack on salt free saltines  Exercise: walks outside some,   Home BP readings: no home cuff      Adherence Assessment  Do you ever forget to take your medication? [] Yes [x] No  Do you ever skip doses due to side effects? [] Yes [x] No  Do you have trouble affording your medicines? [] Yes [x] No  Are you  ever unable to pick up your medication due to transportation difficulties? [] Yes [x] No   Adherence strategy: 7 day minder  Accessory Clinical Findings    Lab Results  Component Value Date   CREATININE 0.86 12/17/2023   BUN 5 (L) 12/17/2023   NA 144 12/17/2023   K 4.2 12/17/2023   CL 106 12/17/2023   CO2 23 12/17/2023   Lab Results  Component Value Date   ALT 15 12/17/2023   AST 18 12/17/2023   ALKPHOS 98 12/17/2023   BILITOT <0.2 12/17/2023   No results found for: HGBA1C  Home Medications    Current Outpatient Medications  Medication Sig Dispense Refill   amLODipine (NORVASC) 5 MG tablet Take 1 tablet (5 mg total) by mouth daily. 90 tablet 3   chlorthalidone (HYGROTON) 25 MG tablet Take 25 mg by mouth daily.     lisinopril (ZESTRIL) 30 MG tablet Take 30 mg by mouth daily.     paliperidone (INVEGA SUSTENNA) 156 MG/ML SUSP injection Inject 156 mg into the muscle every 30 (thirty) days.     rosuvastatin  (CRESTOR ) 40 MG tablet Take 1 tablet (40 mg total) by mouth daily. 90 tablet 3   buPROPion (WELLBUTRIN SR) 150 MG 12 hr tablet Take 150 mg by mouth daily. (Patient not taking: Reported on 02/08/2024)     traZODone (DESYREL) 100 MG tablet Take 200 mg by mouth at bedtime. (Patient  not taking: Reported on 02/08/2024)     No current facility-administered medications for this visit.     HYPERTENSION CONTROL Vitals:   02/08/24 0825 02/08/24 0834  BP: (!) 162/104 (!) 164/85    The patient's blood pressure is elevated above target today.  In order to address the patient's elevated BP: A new medication was prescribed today.; Blood pressure will be monitored at home to determine if medication changes need to be made.      Assessment & Plan    Hypertension Assessment: BP is uncontrolled in office BP 162/104 mmHg;  above the goal (<130/80). Has not been taking amlodipine, suspect ran out and just missed refilling Tolerates lisinopril and chlorthalidone well, without any side  effects Denies SOB, palpitation, headaches,or swelling Notes occasional chest pains, describes as coming and going quickly, not on a regular basis Reiterated the importance of regular exercise and low salt diet   Plan:  Start taking amlodipine 5 mg once daily (not sure how long she has been off 10 mg dose and need to avoid risk of LEE) Continue taking lisinopril 30 mg daily and chlorthalidone 25 mg daily No home BP cuff.   Patient to follow up with me in 1 month  Labs ordered today:  none   Allean Mink PharmD CPP [facility]  [address] [place], [place] [address] [phone]

## 2024-02-08 NOTE — Patient Instructions (Addendum)
 Follow up appointment: Monday July 28 at 8 am  Take your BP meds as follows:  START AMLODIPINE 5 MG ONCE DAILY  CONTINUE WITH LISINOPRIL 30 MG ONCE DAILY, CHLORTHALIDONE 25 MG ONCE DAILY AND ROSUVASTATIN  40 MG ONCE DAILY  Your blood pressure goal is < 130/80  Important lifestyle changes to control high blood pressure  Intervention  Effect on the BP  Lose extra pounds and watch your waistline Weight loss is one of the most effective lifestyle changes for controlling blood pressure. If you're overweight or obese, losing even a small amount of weight can help reduce blood pressure. Blood pressure might go down by about 1 millimeter of mercury (mm Hg) with each kilogram (about 2.2 pounds) of weight lost.  Exercise regularly As a general goal, aim for at least 30 minutes of moderate physical activity every day. Regular physical activity can lower high blood pressure by about 5 to 8 mm Hg.  Eat a healthy diet Eating a diet rich in whole grains, fruits, vegetables, and low-fat dairy products and low in saturated fat and cholesterol. A healthy diet can lower high blood pressure by up to 11 mm Hg.  Reduce salt (sodium) in your diet Even a small reduction of sodium in the diet can improve heart health and reduce high blood pressure by about 5 to 6 mm Hg.  Limit alcohol One drink equals 12 ounces of beer, 5 ounces of wine, or 1.5 ounces of 80-proof liquor.  Limiting alcohol to less than one drink a day for women or two drinks a day for men can help lower blood pressure by about 4 mm Hg.   If you have any questions or concerns please use My Chart to send questions or call the office at [phone]

## 2024-02-08 NOTE — Assessment & Plan Note (Signed)
 Assessment: BP is uncontrolled in office BP 162/104 mmHg;  above the goal (<130/80). Has not been taking amlodipine, suspect ran out and just missed refilling Tolerates lisinopril and chlorthalidone well, without any side effects Denies SOB, palpitation, headaches,or swelling Notes occasional chest pains, describes as coming and going quickly, not on a regular basis Reiterated the importance of regular exercise and low salt diet   Plan:  Start taking amlodipine 5 mg once daily (not sure how long she has been off 10 mg dose and need to avoid risk of LEE) Continue taking lisinopril 30 mg daily and chlorthalidone 25 mg daily No home BP cuff.   Patient to follow up with me in 1 month  Labs ordered today:  none

## 2024-03-10 ENCOUNTER — Ambulatory Visit: Attending: Internal Medicine | Admitting: Pharmacist Clinician (PhC)/ Clinical Pharmacy Specialist

## 2024-03-10 ENCOUNTER — Encounter: Payer: Self-pay | Admitting: Pharmacist Clinician (PhC)/ Clinical Pharmacy Specialist

## 2024-03-10 VITALS — BP 128/76

## 2024-03-10 DIAGNOSIS — I10 Essential (primary) hypertension: Secondary | ICD-10-CM | POA: Diagnosis present

## 2024-03-10 NOTE — Assessment & Plan Note (Signed)
 Assessment: BP is controlled in office BP 128/76 mmHg; Tolerates lisinopril, amlodipine  and chlorthalidone well, without any side effects Denies SOB, palpitation, chest pain, headaches,or swelling Reiterated the importance of regular exercise and low salt diet   Plan:  Continue taking  lisinopril 30 mg every day, chlorthalidone 25 mg every day, amlodipine  5 mg every day  Patient to keep record of BP readings with heart rate and report to us  at the next visit Patient to follow up with Dr.Schumann in November  Labs ordered today:  none

## 2024-03-10 NOTE — Patient Instructions (Signed)
 Follow up appointment: with Dr. Kate in November   Take your BP meds as follows:  Continue with lisinopril, chlorthalidone and amlodipine   Check your blood pressure at home daily (if able) and keep record of the readings.  Always bring your blood pressure log with you to your appointments. If you have not brought your monitor in to be double checked for accuracy, please bring it to your next appointment.  You can find a list of quality blood pressure cuffs at [email or web address]  Important lifestyle changes to control high blood pressure  Intervention  Effect on the BP  Lose extra pounds and watch your waistline Weight loss is one of the most effective lifestyle changes for controlling blood pressure. If you're overweight or obese, losing even a small amount of weight can help reduce blood pressure. Blood pressure might go down by about 1 millimeter of mercury (mm Hg) with each kilogram (about 2.2 pounds) of weight lost.  Exercise regularly As a general goal, aim for at least 30 minutes of moderate physical activity every day. Regular physical activity can lower high blood pressure by about 5 to 8 mm Hg.  Eat a healthy diet Eating a diet rich in whole grains, fruits, vegetables, and low-fat dairy products and low in saturated fat and cholesterol. A healthy diet can lower high blood pressure by up to 11 mm Hg.  Reduce salt (sodium) in your diet Even a small reduction of sodium in the diet can improve heart health and reduce high blood pressure by about 5 to 6 mm Hg.  Limit alcohol One drink equals 12 ounces of beer, 5 ounces of wine, or 1.5 ounces of 80-proof liquor.  Limiting alcohol to less than one drink a day for women or two drinks a day for men can help lower blood pressure by about 4 mm Hg.   If you have any questions or concerns please use My Chart to send questions or call the office at [phone]

## 2024-03-10 NOTE — Progress Notes (Addendum)
 Office Visit    Patient Name: [name] Date of Encounter: 03/10/2024  Primary Care Provider:  [facility] Primary Cardiologist:  Lonni LITTIE Nanas, MD  Chief Complaint    Hypertension  Significant Past Medical History   HLD 6/25 LDL 117, rosuvastatin  20 mg daily (unsure if using)  CAD 4/21 CAC = 6 (70th percentile)  Obesity BMI 44.43, declined referral to [facility]  palpitations Recently prescribed 14d monitor  Daytime somnolence STOP-BANG = 4, sleep study ordered    No Known Allergies  History of Present Illness    Dana Ayala is a 68 y.o. female patient of Dr Nanas, in the office today for hypertension evaluation.   She was most recently seen by Dr. Nanas in May, with a BP of 142/78.  Patient was unsure what medications she takes, so she was asked to monitor pressure at home and bring all medications to PharmD appointment.  Based on fill dates in Epic, no obvious non-compliance issues.  When I saw her in June she brought her medications with her, and was only on 3 (lisinopril, chlorthalidone, rosuvastatin ).  She did not have any amlodipine , and based on refill data, probably ran out sometime in May.  Today she returns for follow up.  Has been taking all 3 medications without any concerns.  She does not have a home BP machine, and no other readings in Epic since her last visit  Blood Pressure Goal:  130/80  Current Medications:  lisinopril 30 mg every day, chlorthalidone 25 mg every day, amlodipine  5 mg every day   Family Hx:   chart notes both parents and brother had hypertension  Social Hx:      Tobacco:no  Alcohol: no  Caffeine:no  Diet:    home cooked meals, mostly baked chicken and pintos (canned) vegetables - greens, beans (also canned), macaroni;  snack on salt free saltines  Exercise: walks outside some,   Home BP readings: no home cuff      Adherence Assessment  Do you ever forget to take your medication?  [] Yes [x] No  Do you ever skip doses due to side effects? [] Yes [x] No  Do you have trouble affording your medicines? [] Yes [x] No  Are you ever unable to pick up your medication due to transportation difficulties? [] Yes [x] No   Adherence strategy: 7 day minder  Accessory Clinical Findings    Lab Results  Component Value Date   CREATININE 0.86 12/17/2023   BUN 5 (L) 12/17/2023   NA 144 12/17/2023   K 4.2 12/17/2023   CL 106 12/17/2023   CO2 23 12/17/2023   Lab Results  Component Value Date   ALT 15 12/17/2023   AST 18 12/17/2023   ALKPHOS 98 12/17/2023   BILITOT <0.2 12/17/2023   No results found for: HGBA1C  Home Medications    Current Outpatient Medications  Medication Sig Dispense Refill   amLODipine  (NORVASC ) 5 MG tablet Take 1 tablet (5 mg total) by mouth daily. 90 tablet 3   buPROPion (WELLBUTRIN SR) 150 MG 12 hr tablet Take 150 mg by mouth daily. (Patient not taking: Reported on 02/08/2024)     chlorthalidone (HYGROTON) 25 MG tablet Take 25 mg by mouth daily.     lisinopril (ZESTRIL) 30 MG tablet Take 30 mg by mouth daily.     paliperidone (INVEGA SUSTENNA) 156 MG/ML SUSP injection Inject 156 mg into the muscle every 30 (thirty) days.     rosuvastatin  (CRESTOR ) 40 MG tablet Take 1 tablet (  40 mg total) by mouth daily. 90 tablet 3   traZODone (DESYREL) 100 MG tablet Take 200 mg by mouth at bedtime. (Patient not taking: Reported on 02/08/2024)     No current facility-administered medications for this visit.         Assessment & Plan    Hypertension Assessment: BP is controlled in office BP 128/76 mmHg; Tolerates lisinopril, amlodipine  and chlorthalidone well, without any side effects Denies SOB, palpitation, chest pain, headaches,or swelling Reiterated the importance of regular exercise and low salt diet   Plan:  Continue taking  lisinopril 30 mg every day, chlorthalidone 25 mg every day, amlodipine  5 mg every day  Patient to keep record of BP readings with  heart rate and report to us  at the next visit Patient to follow up with Dr.Schumann in November  Labs ordered today:  none   Allean Mink PharmD CPP [facility]  [address] [place], [place] [address] [phone]

## 2024-05-18 NOTE — Progress Notes (Deleted)
 Cardiology Office Note:    Date:  05/18/2024   ID:  Dana Ayala, DOB February 20, 1956, MRN [id number]  PCP:  [facility]  Cardiologist:  Lonni LITTIE Nanas, MD  Electrophysiologist:  None   Referring MD: [facility] *   No chief complaint on file.    History of Present Illness:    Dana Ayala is a 68 y.o. female with a hx of hypertension, morbid obesity, hyperlipidemia, schizophrenia who presents for follow-up.  She was referred by Arland Noe, FNP for evaluation of heart murmur, initially seen on 01/30/2020.    Echocardiogram on 02/02/2020 showed normal biventricular function, grade 1 diastolic dysfunction, no significant valvular disease.  Calcium  score 6 on 03/11/2020 (70th percentile)  Since last clinic visit, she reports she is having palpitations about once per week, last for 1 minute and feels like heart is racing.  She denies any chest pain, dyspnea, lower extremity edema.  She reports some lightheadedness with denies any syncope.  Does report having some swelling in her legs.  She has not been exercising.  Past Medical History:  Diagnosis Date   AIN (anal intraepithelial neoplasia) anal canal    Anemia    Coronary artery calcification    Depression    Elevated serum creatinine    Hepatitis C, chronic (HCC)    Hyperlipidemia    Hypertension    Morbid obesity (HCC)    Prediabetes    Schizophrenia (HCC)     Past Surgical History:  Procedure Laterality Date   COLONOSCOPY     POLYPECTOMY     VAGINAL HYSTERECTOMY      Current Medications: No outpatient medications have been marked as taking for the 05/23/24 encounter (Appointment) with Nanas Lonni LITTIE, MD.     Allergies:   Patient has no known allergies.   Social History   Socioeconomic History   Marital status: Widowed    Spouse name: Not on file   Number of children: 1   Years of education: Not on file   Highest education level: Not on file  Occupational  History   Not on file  Tobacco Use   Smoking status: Never   Smokeless tobacco: Never  Substance and Sexual Activity   Alcohol use: No    Alcohol/week: 0.0 standard drinks of alcohol   Drug use: No   Sexual activity: Not on file  Other Topics Concern   Not on file  Social History Narrative   Not on file   Social Drivers of Health   Financial Resource Strain: Not on file  Food Insecurity: Not on file  Transportation Needs: Not on file  Physical Activity: Not on file  Stress: Not on file  Social Connections: Not on file     Family History: The patient's family history includes Hypertension in her brother, father, and mother. There is no history of Colon polyps, Colon cancer, Esophageal cancer, Rectal cancer, Stomach cancer, or Breast cancer.  ROS:   Please see the history of present illness.    All other systems reviewed and are negative.  EKGs/Labs/Other Studies Reviewed:    The following studies were reviewed today:   EKG:   12/27/23: NSR with PACs, rate 85, poor R wave progression  Recent Labs: 12/17/2023: ALT 15; BUN 5; Creatinine, Ser 0.86; Hemoglobin 10.1; Magnesium 1.9; Platelets 230; Potassium 4.2; Sodium 144  Recent Lipid Panel    Component Value Date/Time   CHOL 181 12/17/2023 0931   TRIG 66 12/17/2023 0931   HDL  52 12/17/2023 0931   CHOLHDL 3.5 12/17/2023 0931   CHOLHDL 2.4 Ratio 02/05/2009 0000   VLDL 14 02/05/2009 0000   LDLCALC 117 (H) 12/17/2023 0931    Physical Exam:    VS:  There were no vitals taken for this visit.    Wt Readings from Last 3 Encounters:  12/17/23 229 lb (103.9 kg)  06/30/22 224 lb 6.4 oz (101.8 kg)  04/08/21 215 lb 12.8 oz (97.9 kg)     GEN:  in no acute distress HEENT: Normal NECK: No JVD CARDIAC: RRR, no murmur heard RESPIRATORY:  Clear to auscultation without rales, wheezing or rhonchi  ABDOMEN: Soft, non-tender, non-distended MUSCULOSKELETAL:  No edema; No deformity  SKIN: Warm and dry NEUROLOGIC:  Alert and  oriented x 3 PSYCHIATRIC:  Normal affect   ASSESSMENT:    No diagnosis found.    PLAN:    Palpitations: Description concerning for arrhythmia, ordered Zio patch x 14 days but was not warm***  Heart murmur: Previously with 2/6 systolic murmur on exam, there are no murmurs on exam today.  Echocardiogram on 02/02/2020 showed normal biventricular function, grade 1 diastolic dysfunction, no significant valvular disease.  Hypertension: On lisinopril 30 mg daily, chlorthalidone 25 mg daily, amlodipine  5 mg daily  Hyperlipidemia: LDL 123 on 11/27/2019, Calcium  score 6 on 03/11/2020 (70th percentile).  Started rosuvastatin , currently on 40 mg daily, LDL 117 on 12/2023.  Questionable compliance***.  Morbid obesity: There is no height or weight on file to calculate BMI.  Discussed referral to healthy weight and wellness but she declines at this time.  Diet/exercise recommended  Daytime somnolence: Sleep study ordered previously but was not done.  Agreeable to having done now.  STOP-BANG 4***  RTC in 6 months***   Medication Adjustments/Labs and Tests Ordered: Current medicines are reviewed at length with the patient today.  Concerns regarding medicines are outlined above.  No orders of the defined types were placed in this encounter.   No orders of the defined types were placed in this encounter.    There are no Patient Instructions on file for this visit.   Signed, Lonni LITTIE Nanas, MD  05/18/2024 2:31 PM    [facility]

## 2024-05-23 ENCOUNTER — Ambulatory Visit: Attending: Cardiology | Admitting: Cardiology

## 2024-05-26 ENCOUNTER — Encounter: Payer: Self-pay | Admitting: Cardiology

## 2024-07-12 ENCOUNTER — Encounter: Payer: Self-pay | Admitting: Gastroenterology

## 2024-08-20 ENCOUNTER — Encounter: Payer: Self-pay | Admitting: Gastroenterology

## 2024-09-10 ENCOUNTER — Encounter

## 2024-09-12 ENCOUNTER — Encounter

## 2024-09-12 ENCOUNTER — Encounter: Payer: Self-pay | Admitting: Gastroenterology

## 2024-09-12 ENCOUNTER — Telehealth: Payer: Self-pay

## 2024-09-12 NOTE — Telephone Encounter (Signed)
Multiple attempts made to reach patient- NO answer-message left for patient to call back to the office to reschedule PV appt- if patient fails to call back to the office prior to end of business day ---PV and procedure appts will be cancelled and a no show letter will be sent to the patient;

## 2024-09-24 ENCOUNTER — Encounter: Admitting: Gastroenterology

## 2024-09-24 ENCOUNTER — Encounter

## 2024-10-10 ENCOUNTER — Encounter: Admitting: Gastroenterology
